# Patient Record
Sex: Male | Born: 1950 | Race: White | Hispanic: No | Marital: Married
Health system: Southern US, Community
[De-identification: ages and names within clinical notes are randomized; demographics above are authoritative.]

## PROBLEM LIST (undated history)

## (undated) DIAGNOSIS — I10 Essential (primary) hypertension: Secondary | ICD-10-CM

## (undated) DIAGNOSIS — E559 Vitamin D deficiency, unspecified: Secondary | ICD-10-CM

## (undated) DIAGNOSIS — E785 Hyperlipidemia, unspecified: Secondary | ICD-10-CM

## (undated) DIAGNOSIS — E079 Disorder of thyroid, unspecified: Secondary | ICD-10-CM

## (undated) DIAGNOSIS — C801 Malignant (primary) neoplasm, unspecified: Secondary | ICD-10-CM

## (undated) DIAGNOSIS — E039 Hypothyroidism, unspecified: Secondary | ICD-10-CM

## (undated) DIAGNOSIS — I70201 Unspecified atherosclerosis of native arteries of extremities, right leg: Secondary | ICD-10-CM

## (undated) DIAGNOSIS — K219 Gastro-esophageal reflux disease without esophagitis: Secondary | ICD-10-CM

## (undated) DIAGNOSIS — E782 Mixed hyperlipidemia: Secondary | ICD-10-CM

## (undated) HISTORY — DX: Essential (primary) hypertension: I10

## (undated) HISTORY — DX: Disorder of thyroid, unspecified: E07.9

## (undated) HISTORY — DX: Hyperlipidemia, unspecified: E78.5

## (undated) HISTORY — PX: APPENDECTOMY: SHX54

---

## 2004-10-23 ENCOUNTER — Ambulatory Visit: Payer: Self-pay | Admitting: Internal Medicine

## 2008-02-15 ENCOUNTER — Ambulatory Visit: Payer: Self-pay | Admitting: Internal Medicine

## 2016-11-12 DIAGNOSIS — Z79899 Other long term (current) drug therapy: Secondary | ICD-10-CM | POA: Diagnosis not present

## 2016-11-12 DIAGNOSIS — E119 Type 2 diabetes mellitus without complications: Secondary | ICD-10-CM | POA: Diagnosis not present

## 2016-11-12 DIAGNOSIS — E782 Mixed hyperlipidemia: Secondary | ICD-10-CM | POA: Diagnosis not present

## 2016-11-12 DIAGNOSIS — Z0000 Encounter for general adult medical examination without abnormal findings: Secondary | ICD-10-CM | POA: Diagnosis not present

## 2016-11-12 DIAGNOSIS — E079 Disorder of thyroid, unspecified: Secondary | ICD-10-CM | POA: Diagnosis not present

## 2016-11-26 DIAGNOSIS — Z79899 Other long term (current) drug therapy: Secondary | ICD-10-CM | POA: Diagnosis not present

## 2016-11-26 DIAGNOSIS — E782 Mixed hyperlipidemia: Secondary | ICD-10-CM | POA: Diagnosis not present

## 2016-11-26 DIAGNOSIS — E119 Type 2 diabetes mellitus without complications: Secondary | ICD-10-CM | POA: Diagnosis not present

## 2016-11-26 DIAGNOSIS — E079 Disorder of thyroid, unspecified: Secondary | ICD-10-CM | POA: Diagnosis not present

## 2017-04-27 DIAGNOSIS — M19011 Primary osteoarthritis, right shoulder: Secondary | ICD-10-CM | POA: Diagnosis not present

## 2017-04-27 DIAGNOSIS — M79601 Pain in right arm: Secondary | ICD-10-CM | POA: Diagnosis not present

## 2017-05-19 DIAGNOSIS — Z0000 Encounter for general adult medical examination without abnormal findings: Secondary | ICD-10-CM | POA: Diagnosis not present

## 2017-05-19 DIAGNOSIS — Z79899 Other long term (current) drug therapy: Secondary | ICD-10-CM | POA: Diagnosis not present

## 2017-05-19 DIAGNOSIS — E119 Type 2 diabetes mellitus without complications: Secondary | ICD-10-CM | POA: Diagnosis not present

## 2017-05-19 DIAGNOSIS — E079 Disorder of thyroid, unspecified: Secondary | ICD-10-CM | POA: Diagnosis not present

## 2017-05-19 DIAGNOSIS — Z1211 Encounter for screening for malignant neoplasm of colon: Secondary | ICD-10-CM | POA: Diagnosis not present

## 2017-05-19 DIAGNOSIS — Z125 Encounter for screening for malignant neoplasm of prostate: Secondary | ICD-10-CM | POA: Diagnosis not present

## 2017-05-19 DIAGNOSIS — E782 Mixed hyperlipidemia: Secondary | ICD-10-CM | POA: Diagnosis not present

## 2017-05-20 DIAGNOSIS — M7581 Other shoulder lesions, right shoulder: Secondary | ICD-10-CM | POA: Diagnosis not present

## 2017-05-30 DIAGNOSIS — Z1211 Encounter for screening for malignant neoplasm of colon: Secondary | ICD-10-CM | POA: Diagnosis not present

## 2017-12-16 DIAGNOSIS — E782 Mixed hyperlipidemia: Secondary | ICD-10-CM | POA: Diagnosis not present

## 2017-12-16 DIAGNOSIS — Z79899 Other long term (current) drug therapy: Secondary | ICD-10-CM | POA: Diagnosis not present

## 2017-12-16 DIAGNOSIS — Z0000 Encounter for general adult medical examination without abnormal findings: Secondary | ICD-10-CM | POA: Diagnosis not present

## 2017-12-16 DIAGNOSIS — E079 Disorder of thyroid, unspecified: Secondary | ICD-10-CM | POA: Diagnosis not present

## 2017-12-16 DIAGNOSIS — E119 Type 2 diabetes mellitus without complications: Secondary | ICD-10-CM | POA: Diagnosis not present

## 2018-03-10 DIAGNOSIS — E782 Mixed hyperlipidemia: Secondary | ICD-10-CM | POA: Diagnosis not present

## 2018-03-10 DIAGNOSIS — Z125 Encounter for screening for malignant neoplasm of prostate: Secondary | ICD-10-CM | POA: Diagnosis not present

## 2018-03-10 DIAGNOSIS — Z79899 Other long term (current) drug therapy: Secondary | ICD-10-CM | POA: Diagnosis not present

## 2018-03-10 DIAGNOSIS — E119 Type 2 diabetes mellitus without complications: Secondary | ICD-10-CM | POA: Diagnosis not present

## 2018-03-10 DIAGNOSIS — E079 Disorder of thyroid, unspecified: Secondary | ICD-10-CM | POA: Diagnosis not present

## 2018-06-16 DIAGNOSIS — E782 Mixed hyperlipidemia: Secondary | ICD-10-CM | POA: Diagnosis not present

## 2018-06-16 DIAGNOSIS — E079 Disorder of thyroid, unspecified: Secondary | ICD-10-CM | POA: Diagnosis not present

## 2018-06-16 DIAGNOSIS — E119 Type 2 diabetes mellitus without complications: Secondary | ICD-10-CM | POA: Diagnosis not present

## 2018-06-16 DIAGNOSIS — Z125 Encounter for screening for malignant neoplasm of prostate: Secondary | ICD-10-CM | POA: Diagnosis not present

## 2018-06-16 DIAGNOSIS — Z79899 Other long term (current) drug therapy: Secondary | ICD-10-CM | POA: Diagnosis not present

## 2018-06-23 DIAGNOSIS — I10 Essential (primary) hypertension: Secondary | ICD-10-CM | POA: Diagnosis not present

## 2018-06-23 DIAGNOSIS — E119 Type 2 diabetes mellitus without complications: Secondary | ICD-10-CM | POA: Diagnosis not present

## 2018-06-23 DIAGNOSIS — E782 Mixed hyperlipidemia: Secondary | ICD-10-CM | POA: Diagnosis not present

## 2018-06-23 DIAGNOSIS — Z0000 Encounter for general adult medical examination without abnormal findings: Secondary | ICD-10-CM | POA: Diagnosis not present

## 2018-06-23 DIAGNOSIS — E079 Disorder of thyroid, unspecified: Secondary | ICD-10-CM | POA: Diagnosis not present

## 2018-06-23 DIAGNOSIS — Z1211 Encounter for screening for malignant neoplasm of colon: Secondary | ICD-10-CM | POA: Diagnosis not present

## 2018-06-28 DIAGNOSIS — Z1211 Encounter for screening for malignant neoplasm of colon: Secondary | ICD-10-CM | POA: Diagnosis not present

## 2018-09-22 DIAGNOSIS — Z79899 Other long term (current) drug therapy: Secondary | ICD-10-CM | POA: Diagnosis not present

## 2018-09-22 DIAGNOSIS — I10 Essential (primary) hypertension: Secondary | ICD-10-CM | POA: Diagnosis not present

## 2018-09-22 DIAGNOSIS — E079 Disorder of thyroid, unspecified: Secondary | ICD-10-CM | POA: Diagnosis not present

## 2018-09-22 DIAGNOSIS — E119 Type 2 diabetes mellitus without complications: Secondary | ICD-10-CM | POA: Diagnosis not present

## 2018-09-22 DIAGNOSIS — E782 Mixed hyperlipidemia: Secondary | ICD-10-CM | POA: Diagnosis not present

## 2018-12-22 DIAGNOSIS — Z79899 Other long term (current) drug therapy: Secondary | ICD-10-CM | POA: Diagnosis not present

## 2018-12-22 DIAGNOSIS — E079 Disorder of thyroid, unspecified: Secondary | ICD-10-CM | POA: Diagnosis not present

## 2018-12-22 DIAGNOSIS — E119 Type 2 diabetes mellitus without complications: Secondary | ICD-10-CM | POA: Diagnosis not present

## 2018-12-22 DIAGNOSIS — E782 Mixed hyperlipidemia: Secondary | ICD-10-CM | POA: Diagnosis not present

## 2018-12-29 DIAGNOSIS — I10 Essential (primary) hypertension: Secondary | ICD-10-CM | POA: Diagnosis not present

## 2018-12-29 DIAGNOSIS — Z79899 Other long term (current) drug therapy: Secondary | ICD-10-CM | POA: Diagnosis not present

## 2018-12-29 DIAGNOSIS — E079 Disorder of thyroid, unspecified: Secondary | ICD-10-CM | POA: Diagnosis not present

## 2018-12-29 DIAGNOSIS — E119 Type 2 diabetes mellitus without complications: Secondary | ICD-10-CM | POA: Diagnosis not present

## 2018-12-29 DIAGNOSIS — E782 Mixed hyperlipidemia: Secondary | ICD-10-CM | POA: Diagnosis not present

## 2018-12-29 DIAGNOSIS — Z125 Encounter for screening for malignant neoplasm of prostate: Secondary | ICD-10-CM | POA: Diagnosis not present

## 2019-06-29 DIAGNOSIS — I10 Essential (primary) hypertension: Secondary | ICD-10-CM | POA: Diagnosis not present

## 2019-06-29 DIAGNOSIS — E079 Disorder of thyroid, unspecified: Secondary | ICD-10-CM | POA: Diagnosis not present

## 2019-06-29 DIAGNOSIS — Z125 Encounter for screening for malignant neoplasm of prostate: Secondary | ICD-10-CM | POA: Diagnosis not present

## 2019-06-29 DIAGNOSIS — Z79899 Other long term (current) drug therapy: Secondary | ICD-10-CM | POA: Diagnosis not present

## 2019-06-29 DIAGNOSIS — E782 Mixed hyperlipidemia: Secondary | ICD-10-CM | POA: Diagnosis not present

## 2019-06-29 DIAGNOSIS — E119 Type 2 diabetes mellitus without complications: Secondary | ICD-10-CM | POA: Diagnosis not present

## 2019-07-06 DIAGNOSIS — E782 Mixed hyperlipidemia: Secondary | ICD-10-CM | POA: Diagnosis not present

## 2019-07-06 DIAGNOSIS — E079 Disorder of thyroid, unspecified: Secondary | ICD-10-CM | POA: Diagnosis not present

## 2019-07-06 DIAGNOSIS — Z0000 Encounter for general adult medical examination without abnormal findings: Secondary | ICD-10-CM | POA: Diagnosis not present

## 2019-07-06 DIAGNOSIS — I10 Essential (primary) hypertension: Secondary | ICD-10-CM | POA: Diagnosis not present

## 2019-07-06 DIAGNOSIS — Z79899 Other long term (current) drug therapy: Secondary | ICD-10-CM | POA: Diagnosis not present

## 2019-07-06 DIAGNOSIS — Z1211 Encounter for screening for malignant neoplasm of colon: Secondary | ICD-10-CM | POA: Diagnosis not present

## 2019-07-06 DIAGNOSIS — E119 Type 2 diabetes mellitus without complications: Secondary | ICD-10-CM | POA: Diagnosis not present

## 2019-07-12 DIAGNOSIS — Z1211 Encounter for screening for malignant neoplasm of colon: Secondary | ICD-10-CM | POA: Diagnosis not present

## 2019-07-28 ENCOUNTER — Ambulatory Visit: Payer: PPO | Attending: Internal Medicine

## 2019-07-28 DIAGNOSIS — Z23 Encounter for immunization: Secondary | ICD-10-CM

## 2019-07-28 NOTE — Progress Notes (Signed)
   Covid-19 Vaccination Clinic  Name:  Tony Solomon    MRN: [id number] DOB: [birth year]  07/28/2019  [name] was observed post Covid-19 immunization for 15 minutes without incidence. He was provided with Vaccine Information Sheet and instruction to access the V-Safe system.   [name] was instructed to call 911 with any severe reactions post vaccine: Marland Kitchen Difficulty breathing  . Swelling of your face and throat  . A fast heartbeat  . A bad rash all over your body  . Dizziness and weakness    Immunizations Administered    Name Date Dose VIS Date Route   Moderna COVID-19 Vaccine 07/28/2019 12:18 PM 0.5 mL 05/01/2019 Intramuscular   Manufacturer: Moderna   Lot: [id number]   [place]:[id number]

## 2019-08-25 ENCOUNTER — Ambulatory Visit: Payer: PPO | Attending: Internal Medicine

## 2019-08-25 DIAGNOSIS — Z23 Encounter for immunization: Secondary | ICD-10-CM

## 2019-08-25 NOTE — Progress Notes (Signed)
   Covid-19 Vaccination Clinic  Name:  Tony Solomon    MRN: [id number] DOB: 07-26-1950  08/25/2019  [name] was observed post Covid-19 immunization for 15 minutes without incident. He was provided with Vaccine Information Sheet and instruction to access the V-Safe system.   [name] was instructed to call 911 with any severe reactions post vaccine: Marland Kitchen Difficulty breathing  . Swelling of face and throat  . A fast heartbeat  . A bad rash all over body  . Dizziness and weakness   Immunizations Administered    Name Date Dose VIS Date Route   Moderna COVID-19 Vaccine 08/25/2019  9:48 AM 0.5 mL 05/01/2019 Intramuscular   Manufacturer: Moderna   Lot: [id number]   [place]:[id number]

## 2019-12-28 DIAGNOSIS — E119 Type 2 diabetes mellitus without complications: Secondary | ICD-10-CM | POA: Diagnosis not present

## 2019-12-28 DIAGNOSIS — E079 Disorder of thyroid, unspecified: Secondary | ICD-10-CM | POA: Diagnosis not present

## 2019-12-28 DIAGNOSIS — Z79899 Other long term (current) drug therapy: Secondary | ICD-10-CM | POA: Diagnosis not present

## 2019-12-28 DIAGNOSIS — E782 Mixed hyperlipidemia: Secondary | ICD-10-CM | POA: Diagnosis not present

## 2019-12-28 DIAGNOSIS — I10 Essential (primary) hypertension: Secondary | ICD-10-CM | POA: Diagnosis not present

## 2020-01-04 DIAGNOSIS — E079 Disorder of thyroid, unspecified: Secondary | ICD-10-CM | POA: Diagnosis not present

## 2020-01-04 DIAGNOSIS — Z125 Encounter for screening for malignant neoplasm of prostate: Secondary | ICD-10-CM | POA: Diagnosis not present

## 2020-01-04 DIAGNOSIS — Z79899 Other long term (current) drug therapy: Secondary | ICD-10-CM | POA: Diagnosis not present

## 2020-01-04 DIAGNOSIS — E782 Mixed hyperlipidemia: Secondary | ICD-10-CM | POA: Diagnosis not present

## 2020-01-04 DIAGNOSIS — E119 Type 2 diabetes mellitus without complications: Secondary | ICD-10-CM | POA: Diagnosis not present

## 2020-01-04 DIAGNOSIS — I10 Essential (primary) hypertension: Secondary | ICD-10-CM | POA: Diagnosis not present

## 2020-03-12 DIAGNOSIS — E119 Type 2 diabetes mellitus without complications: Secondary | ICD-10-CM | POA: Diagnosis not present

## 2020-07-04 DIAGNOSIS — I10 Essential (primary) hypertension: Secondary | ICD-10-CM | POA: Diagnosis not present

## 2020-07-04 DIAGNOSIS — E079 Disorder of thyroid, unspecified: Secondary | ICD-10-CM | POA: Diagnosis not present

## 2020-07-04 DIAGNOSIS — E119 Type 2 diabetes mellitus without complications: Secondary | ICD-10-CM | POA: Diagnosis not present

## 2020-07-04 DIAGNOSIS — Z125 Encounter for screening for malignant neoplasm of prostate: Secondary | ICD-10-CM | POA: Diagnosis not present

## 2020-07-04 DIAGNOSIS — Z79899 Other long term (current) drug therapy: Secondary | ICD-10-CM | POA: Diagnosis not present

## 2020-07-04 DIAGNOSIS — E782 Mixed hyperlipidemia: Secondary | ICD-10-CM | POA: Diagnosis not present

## 2020-07-11 DIAGNOSIS — E7800 Pure hypercholesterolemia, unspecified: Secondary | ICD-10-CM | POA: Diagnosis not present

## 2020-07-11 DIAGNOSIS — I10 Essential (primary) hypertension: Secondary | ICD-10-CM | POA: Diagnosis not present

## 2020-07-11 DIAGNOSIS — R011 Cardiac murmur, unspecified: Secondary | ICD-10-CM | POA: Diagnosis not present

## 2020-07-11 DIAGNOSIS — Z79899 Other long term (current) drug therapy: Secondary | ICD-10-CM | POA: Diagnosis not present

## 2020-07-11 DIAGNOSIS — Z1211 Encounter for screening for malignant neoplasm of colon: Secondary | ICD-10-CM | POA: Diagnosis not present

## 2020-07-11 DIAGNOSIS — E119 Type 2 diabetes mellitus without complications: Secondary | ICD-10-CM | POA: Diagnosis not present

## 2020-07-11 DIAGNOSIS — E079 Disorder of thyroid, unspecified: Secondary | ICD-10-CM | POA: Diagnosis not present

## 2020-07-11 DIAGNOSIS — Z0000 Encounter for general adult medical examination without abnormal findings: Secondary | ICD-10-CM | POA: Diagnosis not present

## 2020-07-15 DIAGNOSIS — Z0000 Encounter for general adult medical examination without abnormal findings: Secondary | ICD-10-CM | POA: Diagnosis not present

## 2020-07-15 DIAGNOSIS — E079 Disorder of thyroid, unspecified: Secondary | ICD-10-CM | POA: Diagnosis not present

## 2020-07-15 DIAGNOSIS — Z1211 Encounter for screening for malignant neoplasm of colon: Secondary | ICD-10-CM | POA: Diagnosis not present

## 2020-07-15 DIAGNOSIS — E119 Type 2 diabetes mellitus without complications: Secondary | ICD-10-CM | POA: Diagnosis not present

## 2020-07-15 DIAGNOSIS — Z79899 Other long term (current) drug therapy: Secondary | ICD-10-CM | POA: Diagnosis not present

## 2020-07-15 DIAGNOSIS — E7800 Pure hypercholesterolemia, unspecified: Secondary | ICD-10-CM | POA: Diagnosis not present

## 2020-07-15 DIAGNOSIS — I10 Essential (primary) hypertension: Secondary | ICD-10-CM | POA: Diagnosis not present

## 2020-07-15 DIAGNOSIS — R011 Cardiac murmur, unspecified: Secondary | ICD-10-CM | POA: Diagnosis not present

## 2020-08-20 DIAGNOSIS — R011 Cardiac murmur, unspecified: Secondary | ICD-10-CM | POA: Diagnosis not present

## 2020-10-16 DIAGNOSIS — E079 Disorder of thyroid, unspecified: Secondary | ICD-10-CM | POA: Diagnosis not present

## 2020-10-16 DIAGNOSIS — Z79899 Other long term (current) drug therapy: Secondary | ICD-10-CM | POA: Diagnosis not present

## 2020-10-16 DIAGNOSIS — E119 Type 2 diabetes mellitus without complications: Secondary | ICD-10-CM | POA: Diagnosis not present

## 2020-10-16 DIAGNOSIS — E7800 Pure hypercholesterolemia, unspecified: Secondary | ICD-10-CM | POA: Diagnosis not present

## 2020-10-23 DIAGNOSIS — Z79899 Other long term (current) drug therapy: Secondary | ICD-10-CM | POA: Diagnosis not present

## 2020-10-23 DIAGNOSIS — E079 Disorder of thyroid, unspecified: Secondary | ICD-10-CM | POA: Diagnosis not present

## 2020-10-23 DIAGNOSIS — E119 Type 2 diabetes mellitus without complications: Secondary | ICD-10-CM | POA: Diagnosis not present

## 2020-10-23 DIAGNOSIS — E782 Mixed hyperlipidemia: Secondary | ICD-10-CM | POA: Diagnosis not present

## 2020-10-23 DIAGNOSIS — I10 Essential (primary) hypertension: Secondary | ICD-10-CM | POA: Diagnosis not present

## 2020-12-25 DIAGNOSIS — M545 Low back pain, unspecified: Secondary | ICD-10-CM | POA: Diagnosis not present

## 2020-12-25 DIAGNOSIS — M5489 Other dorsalgia: Secondary | ICD-10-CM | POA: Diagnosis not present

## 2021-04-17 DIAGNOSIS — M5442 Lumbago with sciatica, left side: Secondary | ICD-10-CM | POA: Diagnosis not present

## 2021-04-17 DIAGNOSIS — G5622 Lesion of ulnar nerve, left upper limb: Secondary | ICD-10-CM | POA: Diagnosis not present

## 2021-04-20 DIAGNOSIS — E782 Mixed hyperlipidemia: Secondary | ICD-10-CM | POA: Diagnosis not present

## 2021-04-20 DIAGNOSIS — E119 Type 2 diabetes mellitus without complications: Secondary | ICD-10-CM | POA: Diagnosis not present

## 2021-04-20 DIAGNOSIS — Z79899 Other long term (current) drug therapy: Secondary | ICD-10-CM | POA: Diagnosis not present

## 2021-04-20 DIAGNOSIS — E079 Disorder of thyroid, unspecified: Secondary | ICD-10-CM | POA: Diagnosis not present

## 2021-04-27 DIAGNOSIS — Z125 Encounter for screening for malignant neoplasm of prostate: Secondary | ICD-10-CM | POA: Diagnosis not present

## 2021-04-27 DIAGNOSIS — E118 Type 2 diabetes mellitus with unspecified complications: Secondary | ICD-10-CM | POA: Diagnosis not present

## 2021-04-27 DIAGNOSIS — E079 Disorder of thyroid, unspecified: Secondary | ICD-10-CM | POA: Diagnosis not present

## 2021-04-27 DIAGNOSIS — E7800 Pure hypercholesterolemia, unspecified: Secondary | ICD-10-CM | POA: Diagnosis not present

## 2021-04-27 DIAGNOSIS — I10 Essential (primary) hypertension: Secondary | ICD-10-CM | POA: Diagnosis not present

## 2021-04-27 DIAGNOSIS — Z79899 Other long term (current) drug therapy: Secondary | ICD-10-CM | POA: Diagnosis not present

## 2021-06-25 ENCOUNTER — Other Ambulatory Visit: Payer: Self-pay | Admitting: Physical Medicine & Rehabilitation

## 2021-06-25 DIAGNOSIS — M5442 Lumbago with sciatica, left side: Secondary | ICD-10-CM

## 2021-06-25 DIAGNOSIS — G5622 Lesion of ulnar nerve, left upper limb: Secondary | ICD-10-CM | POA: Diagnosis not present

## 2021-06-28 ENCOUNTER — Other Ambulatory Visit: Payer: Self-pay

## 2021-06-28 ENCOUNTER — Ambulatory Visit
Admission: RE | Admit: 2021-06-28 | Discharge: 2021-06-28 | Disposition: A | Discharge status: Home / Self Care | Payer: PPO | Source: Ambulatory Visit | Attending: Physical Medicine & Rehabilitation | Admitting: Physical Medicine & Rehabilitation

## 2021-06-28 DIAGNOSIS — M5442 Lumbago with sciatica, left side: Secondary | ICD-10-CM | POA: Diagnosis not present

## 2021-06-28 DIAGNOSIS — M5127 Other intervertebral disc displacement, lumbosacral region: Secondary | ICD-10-CM | POA: Diagnosis not present

## 2021-06-28 DIAGNOSIS — M47816 Spondylosis without myelopathy or radiculopathy, lumbar region: Secondary | ICD-10-CM | POA: Diagnosis not present

## 2021-07-02 DIAGNOSIS — M5442 Lumbago with sciatica, left side: Secondary | ICD-10-CM | POA: Diagnosis not present

## 2021-07-02 DIAGNOSIS — G5622 Lesion of ulnar nerve, left upper limb: Secondary | ICD-10-CM | POA: Diagnosis not present

## 2021-07-02 DIAGNOSIS — M48062 Spinal stenosis, lumbar region with neurogenic claudication: Secondary | ICD-10-CM | POA: Diagnosis not present

## 2021-07-09 ENCOUNTER — Ambulatory Visit: Payer: PPO

## 2021-07-10 DIAGNOSIS — E119 Type 2 diabetes mellitus without complications: Secondary | ICD-10-CM | POA: Diagnosis not present

## 2021-07-10 DIAGNOSIS — M48062 Spinal stenosis, lumbar region with neurogenic claudication: Secondary | ICD-10-CM | POA: Diagnosis not present

## 2021-07-24 DIAGNOSIS — M48062 Spinal stenosis, lumbar region with neurogenic claudication: Secondary | ICD-10-CM | POA: Diagnosis not present

## 2021-07-24 DIAGNOSIS — M5442 Lumbago with sciatica, left side: Secondary | ICD-10-CM | POA: Diagnosis not present

## 2021-10-09 DIAGNOSIS — H66003 Acute suppurative otitis media without spontaneous rupture of ear drum, bilateral: Secondary | ICD-10-CM | POA: Diagnosis not present

## 2021-10-09 DIAGNOSIS — R112 Nausea with vomiting, unspecified: Secondary | ICD-10-CM | POA: Diagnosis not present

## 2021-10-09 DIAGNOSIS — J019 Acute sinusitis, unspecified: Secondary | ICD-10-CM | POA: Diagnosis not present

## 2021-10-09 DIAGNOSIS — B9689 Other specified bacterial agents as the cause of diseases classified elsewhere: Secondary | ICD-10-CM | POA: Diagnosis not present

## 2021-10-09 DIAGNOSIS — H8309 Labyrinthitis, unspecified ear: Secondary | ICD-10-CM | POA: Diagnosis not present

## 2021-10-09 DIAGNOSIS — Z03818 Encounter for observation for suspected exposure to other biological agents ruled out: Secondary | ICD-10-CM | POA: Diagnosis not present

## 2021-10-09 DIAGNOSIS — J302 Other seasonal allergic rhinitis: Secondary | ICD-10-CM | POA: Diagnosis not present

## 2021-10-23 DIAGNOSIS — E079 Disorder of thyroid, unspecified: Secondary | ICD-10-CM | POA: Diagnosis not present

## 2021-10-23 DIAGNOSIS — Z125 Encounter for screening for malignant neoplasm of prostate: Secondary | ICD-10-CM | POA: Diagnosis not present

## 2021-10-23 DIAGNOSIS — I10 Essential (primary) hypertension: Secondary | ICD-10-CM | POA: Diagnosis not present

## 2021-10-23 DIAGNOSIS — Z79899 Other long term (current) drug therapy: Secondary | ICD-10-CM | POA: Diagnosis not present

## 2021-10-23 DIAGNOSIS — E118 Type 2 diabetes mellitus with unspecified complications: Secondary | ICD-10-CM | POA: Diagnosis not present

## 2021-10-23 DIAGNOSIS — E7800 Pure hypercholesterolemia, unspecified: Secondary | ICD-10-CM | POA: Diagnosis not present

## 2021-10-30 DIAGNOSIS — Z79899 Other long term (current) drug therapy: Secondary | ICD-10-CM | POA: Diagnosis not present

## 2021-10-30 DIAGNOSIS — E079 Disorder of thyroid, unspecified: Secondary | ICD-10-CM | POA: Diagnosis not present

## 2021-10-30 DIAGNOSIS — Z0000 Encounter for general adult medical examination without abnormal findings: Secondary | ICD-10-CM | POA: Diagnosis not present

## 2021-10-30 DIAGNOSIS — Z1211 Encounter for screening for malignant neoplasm of colon: Secondary | ICD-10-CM | POA: Diagnosis not present

## 2021-10-30 DIAGNOSIS — E782 Mixed hyperlipidemia: Secondary | ICD-10-CM | POA: Diagnosis not present

## 2021-10-30 DIAGNOSIS — E118 Type 2 diabetes mellitus with unspecified complications: Secondary | ICD-10-CM | POA: Diagnosis not present

## 2021-10-30 DIAGNOSIS — I10 Essential (primary) hypertension: Secondary | ICD-10-CM | POA: Diagnosis not present

## 2021-11-03 DIAGNOSIS — Z1211 Encounter for screening for malignant neoplasm of colon: Secondary | ICD-10-CM | POA: Diagnosis not present

## 2021-11-26 DIAGNOSIS — Z85828 Personal history of other malignant neoplasm of skin: Secondary | ICD-10-CM | POA: Diagnosis not present

## 2021-11-26 DIAGNOSIS — L814 Other melanin hyperpigmentation: Secondary | ICD-10-CM | POA: Diagnosis not present

## 2021-11-26 DIAGNOSIS — C44319 Basal cell carcinoma of skin of other parts of face: Secondary | ICD-10-CM | POA: Diagnosis not present

## 2021-11-26 DIAGNOSIS — L578 Other skin changes due to chronic exposure to nonionizing radiation: Secondary | ICD-10-CM | POA: Diagnosis not present

## 2021-11-26 DIAGNOSIS — L821 Other seborrheic keratosis: Secondary | ICD-10-CM | POA: Diagnosis not present

## 2021-11-26 DIAGNOSIS — L570 Actinic keratosis: Secondary | ICD-10-CM | POA: Diagnosis not present

## 2021-11-26 DIAGNOSIS — C44329 Squamous cell carcinoma of skin of other parts of face: Secondary | ICD-10-CM | POA: Diagnosis not present

## 2021-11-26 DIAGNOSIS — D492 Neoplasm of unspecified behavior of bone, soft tissue, and skin: Secondary | ICD-10-CM | POA: Diagnosis not present

## 2021-12-04 DIAGNOSIS — Z79899 Other long term (current) drug therapy: Secondary | ICD-10-CM | POA: Diagnosis not present

## 2021-12-25 DIAGNOSIS — L814 Other melanin hyperpigmentation: Secondary | ICD-10-CM | POA: Diagnosis not present

## 2021-12-25 DIAGNOSIS — L578 Other skin changes due to chronic exposure to nonionizing radiation: Secondary | ICD-10-CM | POA: Diagnosis not present

## 2021-12-25 DIAGNOSIS — C44329 Squamous cell carcinoma of skin of other parts of face: Secondary | ICD-10-CM | POA: Diagnosis not present

## 2021-12-25 DIAGNOSIS — C44319 Basal cell carcinoma of skin of other parts of face: Secondary | ICD-10-CM | POA: Diagnosis not present

## 2021-12-25 DIAGNOSIS — L988 Other specified disorders of the skin and subcutaneous tissue: Secondary | ICD-10-CM | POA: Diagnosis not present

## 2022-02-22 DIAGNOSIS — M5416 Radiculopathy, lumbar region: Secondary | ICD-10-CM | POA: Diagnosis not present

## 2022-02-22 DIAGNOSIS — M48062 Spinal stenosis, lumbar region with neurogenic claudication: Secondary | ICD-10-CM | POA: Diagnosis not present

## 2022-03-26 DIAGNOSIS — M5416 Radiculopathy, lumbar region: Secondary | ICD-10-CM | POA: Diagnosis not present

## 2022-03-26 DIAGNOSIS — M5136 Other intervertebral disc degeneration, lumbar region: Secondary | ICD-10-CM | POA: Diagnosis not present

## 2022-05-07 DIAGNOSIS — I10 Essential (primary) hypertension: Secondary | ICD-10-CM | POA: Diagnosis not present

## 2022-05-07 DIAGNOSIS — E079 Disorder of thyroid, unspecified: Secondary | ICD-10-CM | POA: Diagnosis not present

## 2022-05-07 DIAGNOSIS — E782 Mixed hyperlipidemia: Secondary | ICD-10-CM | POA: Diagnosis not present

## 2022-05-07 DIAGNOSIS — Z79899 Other long term (current) drug therapy: Secondary | ICD-10-CM | POA: Diagnosis not present

## 2022-05-07 DIAGNOSIS — E118 Type 2 diabetes mellitus with unspecified complications: Secondary | ICD-10-CM | POA: Diagnosis not present

## 2022-05-28 DIAGNOSIS — M48062 Spinal stenosis, lumbar region with neurogenic claudication: Secondary | ICD-10-CM | POA: Diagnosis not present

## 2022-05-28 DIAGNOSIS — M5416 Radiculopathy, lumbar region: Secondary | ICD-10-CM | POA: Diagnosis not present

## 2022-06-01 DIAGNOSIS — L570 Actinic keratosis: Secondary | ICD-10-CM | POA: Diagnosis not present

## 2022-06-01 DIAGNOSIS — Z85828 Personal history of other malignant neoplasm of skin: Secondary | ICD-10-CM | POA: Diagnosis not present

## 2022-06-01 DIAGNOSIS — L578 Other skin changes due to chronic exposure to nonionizing radiation: Secondary | ICD-10-CM | POA: Diagnosis not present

## 2022-06-18 DIAGNOSIS — M5416 Radiculopathy, lumbar region: Secondary | ICD-10-CM | POA: Diagnosis not present

## 2022-06-18 DIAGNOSIS — M5136 Other intervertebral disc degeneration, lumbar region: Secondary | ICD-10-CM | POA: Diagnosis not present

## 2022-06-18 DIAGNOSIS — M48062 Spinal stenosis, lumbar region with neurogenic claudication: Secondary | ICD-10-CM | POA: Diagnosis not present

## 2022-09-06 DIAGNOSIS — M79605 Pain in left leg: Secondary | ICD-10-CM | POA: Diagnosis not present

## 2022-09-06 DIAGNOSIS — M79604 Pain in right leg: Secondary | ICD-10-CM | POA: Diagnosis not present

## 2022-09-14 DIAGNOSIS — L578 Other skin changes due to chronic exposure to nonionizing radiation: Secondary | ICD-10-CM | POA: Diagnosis not present

## 2022-09-14 DIAGNOSIS — L814 Other melanin hyperpigmentation: Secondary | ICD-10-CM | POA: Diagnosis not present

## 2022-09-14 DIAGNOSIS — D492 Neoplasm of unspecified behavior of bone, soft tissue, and skin: Secondary | ICD-10-CM | POA: Diagnosis not present

## 2022-09-14 DIAGNOSIS — Z86007 Personal history of in-situ neoplasm of skin: Secondary | ICD-10-CM | POA: Diagnosis not present

## 2022-09-14 DIAGNOSIS — L821 Other seborrheic keratosis: Secondary | ICD-10-CM | POA: Diagnosis not present

## 2022-09-14 DIAGNOSIS — L570 Actinic keratosis: Secondary | ICD-10-CM | POA: Diagnosis not present

## 2022-11-15 DIAGNOSIS — Z0000 Encounter for general adult medical examination without abnormal findings: Secondary | ICD-10-CM | POA: Diagnosis not present

## 2022-11-15 DIAGNOSIS — E079 Disorder of thyroid, unspecified: Secondary | ICD-10-CM | POA: Diagnosis not present

## 2022-11-15 DIAGNOSIS — I10 Essential (primary) hypertension: Secondary | ICD-10-CM | POA: Diagnosis not present

## 2022-11-15 DIAGNOSIS — E118 Type 2 diabetes mellitus with unspecified complications: Secondary | ICD-10-CM | POA: Diagnosis not present

## 2022-11-15 DIAGNOSIS — Z1211 Encounter for screening for malignant neoplasm of colon: Secondary | ICD-10-CM | POA: Diagnosis not present

## 2022-11-15 DIAGNOSIS — Z79899 Other long term (current) drug therapy: Secondary | ICD-10-CM | POA: Diagnosis not present

## 2022-11-15 DIAGNOSIS — E559 Vitamin D deficiency, unspecified: Secondary | ICD-10-CM | POA: Diagnosis not present

## 2022-11-15 DIAGNOSIS — Z125 Encounter for screening for malignant neoplasm of prostate: Secondary | ICD-10-CM | POA: Diagnosis not present

## 2022-11-15 DIAGNOSIS — E782 Mixed hyperlipidemia: Secondary | ICD-10-CM | POA: Diagnosis not present

## 2022-11-19 DIAGNOSIS — Z1211 Encounter for screening for malignant neoplasm of colon: Secondary | ICD-10-CM | POA: Diagnosis not present

## 2023-02-23 DIAGNOSIS — E871 Hypo-osmolality and hyponatremia: Secondary | ICD-10-CM | POA: Diagnosis not present

## 2023-02-23 DIAGNOSIS — Z79899 Other long term (current) drug therapy: Secondary | ICD-10-CM | POA: Diagnosis not present

## 2023-02-23 DIAGNOSIS — D649 Anemia, unspecified: Secondary | ICD-10-CM | POA: Diagnosis not present

## 2023-03-31 DIAGNOSIS — D649 Anemia, unspecified: Secondary | ICD-10-CM | POA: Diagnosis not present

## 2023-05-09 DIAGNOSIS — E782 Mixed hyperlipidemia: Secondary | ICD-10-CM | POA: Diagnosis not present

## 2023-05-09 DIAGNOSIS — E118 Type 2 diabetes mellitus with unspecified complications: Secondary | ICD-10-CM | POA: Diagnosis not present

## 2023-05-09 DIAGNOSIS — E079 Disorder of thyroid, unspecified: Secondary | ICD-10-CM | POA: Diagnosis not present

## 2023-05-09 DIAGNOSIS — Z79899 Other long term (current) drug therapy: Secondary | ICD-10-CM | POA: Diagnosis not present

## 2023-05-16 DIAGNOSIS — E079 Disorder of thyroid, unspecified: Secondary | ICD-10-CM | POA: Diagnosis not present

## 2023-05-16 DIAGNOSIS — E118 Type 2 diabetes mellitus with unspecified complications: Secondary | ICD-10-CM | POA: Diagnosis not present

## 2023-05-16 DIAGNOSIS — I10 Essential (primary) hypertension: Secondary | ICD-10-CM | POA: Diagnosis not present

## 2023-05-16 DIAGNOSIS — Z0000 Encounter for general adult medical examination without abnormal findings: Secondary | ICD-10-CM | POA: Diagnosis not present

## 2023-05-16 DIAGNOSIS — E782 Mixed hyperlipidemia: Secondary | ICD-10-CM | POA: Diagnosis not present

## 2023-07-15 DIAGNOSIS — M5416 Radiculopathy, lumbar region: Secondary | ICD-10-CM | POA: Diagnosis not present

## 2023-07-15 DIAGNOSIS — E118 Type 2 diabetes mellitus with unspecified complications: Secondary | ICD-10-CM | POA: Diagnosis not present

## 2023-08-03 DIAGNOSIS — M5442 Lumbago with sciatica, left side: Secondary | ICD-10-CM | POA: Diagnosis not present

## 2023-08-03 DIAGNOSIS — G8929 Other chronic pain: Secondary | ICD-10-CM | POA: Diagnosis not present

## 2023-08-03 DIAGNOSIS — M5416 Radiculopathy, lumbar region: Secondary | ICD-10-CM | POA: Diagnosis not present

## 2023-08-24 DIAGNOSIS — M79605 Pain in left leg: Secondary | ICD-10-CM | POA: Diagnosis not present

## 2023-08-24 DIAGNOSIS — Z79899 Other long term (current) drug therapy: Secondary | ICD-10-CM | POA: Diagnosis not present

## 2023-08-24 DIAGNOSIS — E118 Type 2 diabetes mellitus with unspecified complications: Secondary | ICD-10-CM | POA: Diagnosis not present

## 2023-08-24 DIAGNOSIS — I10 Essential (primary) hypertension: Secondary | ICD-10-CM | POA: Diagnosis not present

## 2023-08-24 DIAGNOSIS — E7800 Pure hypercholesterolemia, unspecified: Secondary | ICD-10-CM | POA: Diagnosis not present

## 2023-08-24 DIAGNOSIS — E079 Disorder of thyroid, unspecified: Secondary | ICD-10-CM | POA: Diagnosis not present

## 2023-08-24 DIAGNOSIS — E559 Vitamin D deficiency, unspecified: Secondary | ICD-10-CM | POA: Diagnosis not present

## 2023-08-24 DIAGNOSIS — M79604 Pain in right leg: Secondary | ICD-10-CM | POA: Diagnosis not present

## 2023-08-30 DIAGNOSIS — M79604 Pain in right leg: Secondary | ICD-10-CM | POA: Diagnosis not present

## 2023-08-30 DIAGNOSIS — M79605 Pain in left leg: Secondary | ICD-10-CM | POA: Diagnosis not present

## 2023-09-22 IMAGING — MR MR LUMBAR SPINE W/O CM
5 series · 31 of 48 positions shown · non-contrast
Comparison: None.

CLINICAL DATA: Low back pain for 5 months.  No known injury.

EXAM:
MRI LUMBAR SPINE WITHOUT CONTRAST
TECHNIQUE: Multiplanar, multisequence MR imaging of the lumbar spine was
performed. No intravenous contrast was administered.

[Series 5: T2 · sagittal · 4.0mm · 0.81mm/px · 6 of 17 slices shown (1 of 2)]
[im 1/17]
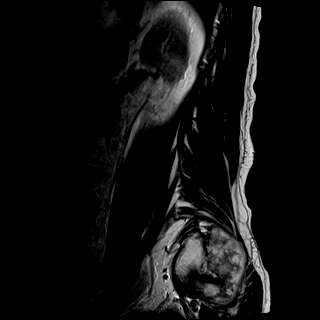
[im 4/17]
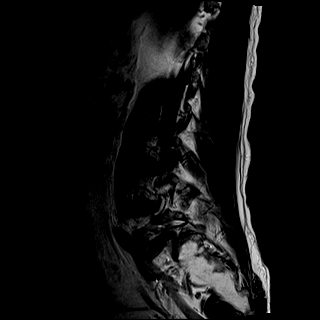
[im 7/17]
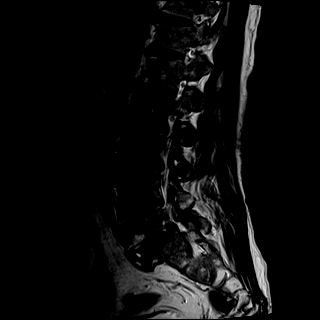
[im 10/17]
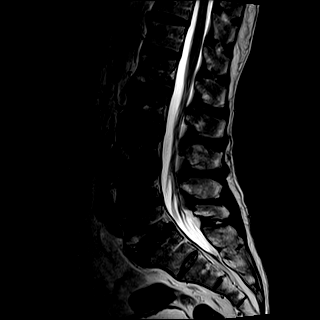
[im 13/17]
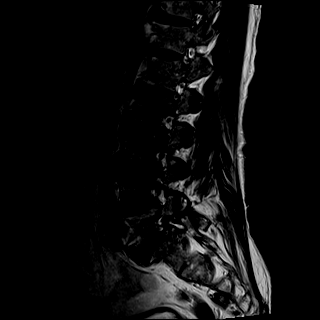
[im 17/17]
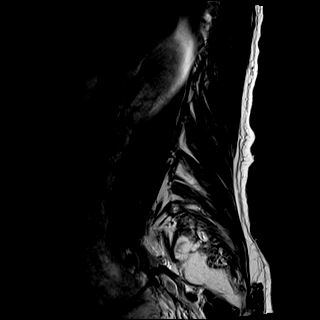

[Series 6: T1 · sagittal · 4.0mm · 0.81mm/px · 7 of 17 slices shown (1 of 2)]
[im 1/17]
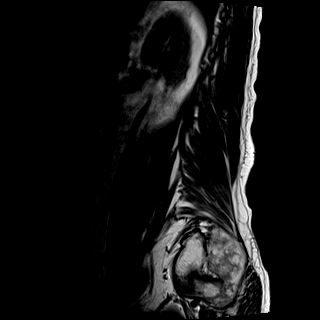
[im 3/17]
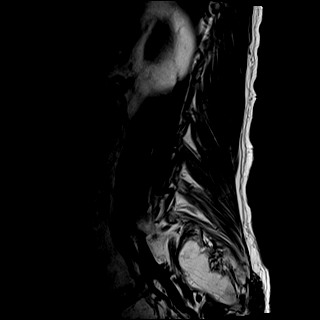
[im 6/17]
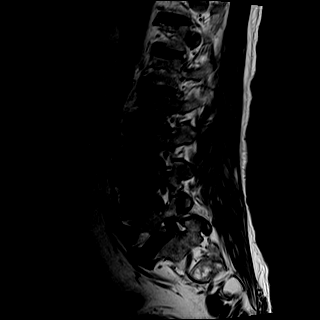
[im 9/17]
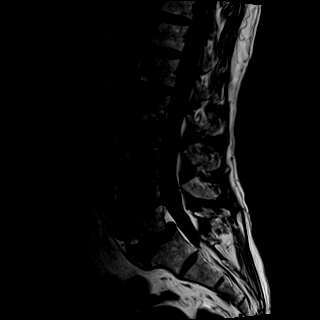
[im 11/17]
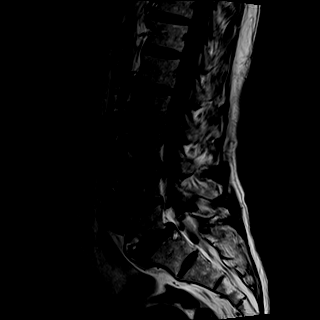
[im 14/17]
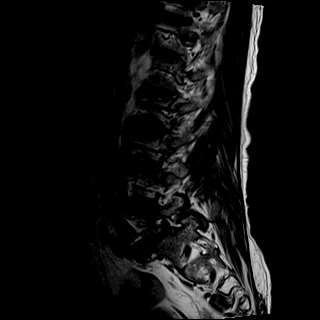
[im 17/17]
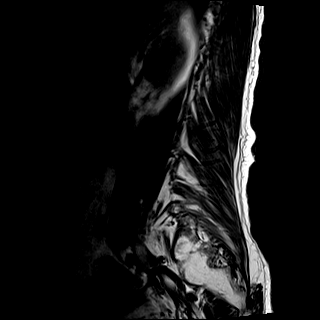

[Series 7: STIR · sagittal · 4.0mm · 0.41mm/px · 2 of 17 slices shown]
[im 1/17]
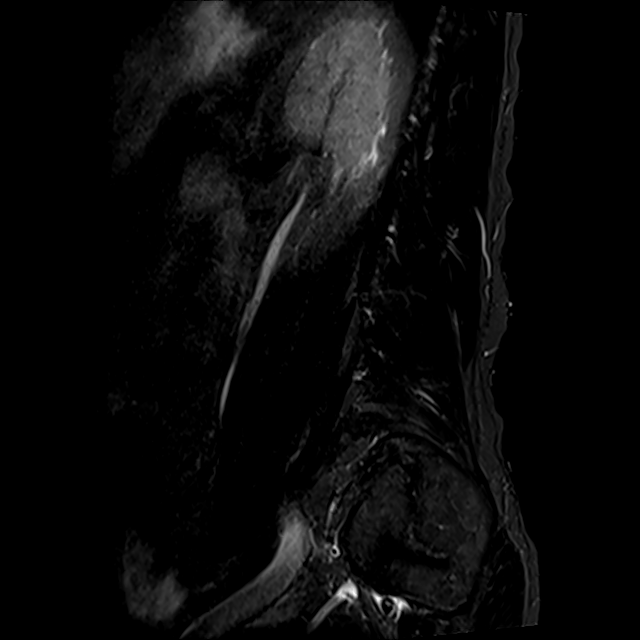
[im 3/17]
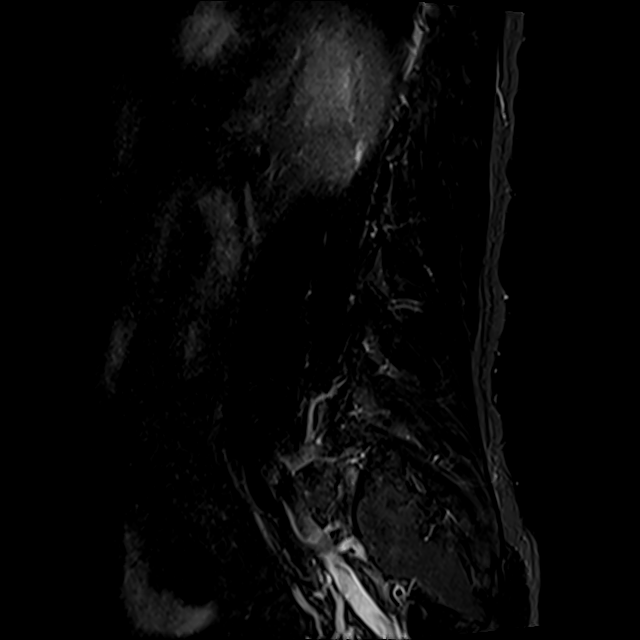

[Series 10: T2 · axial · 4.0mm · 0.78mm/px · z∈[-71,+150]mm · 8 of 37 slices shown (2 of 2)]
[im 1/37]
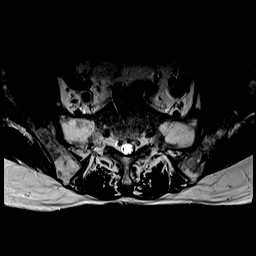
[im 6/37]
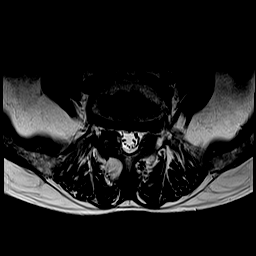
[im 12/37]
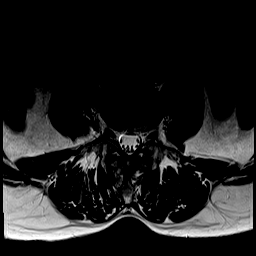
[im 17/37]
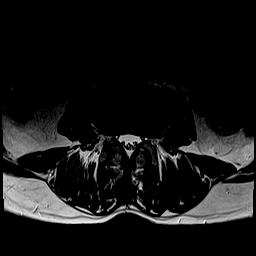
[im 20/37]
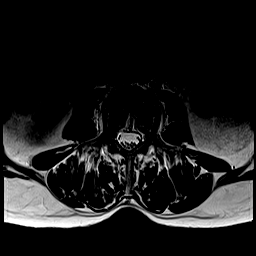
[im 25/37]
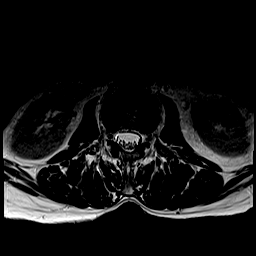
[im 31/37]
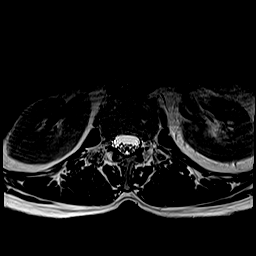
[im 37/37]
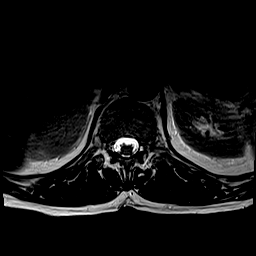

[Series 14: T1 · axial · 4.0mm · 0.39mm/px · z∈[-71,+150]mm · 8 of 37 slices shown (2 of 2)]
[im 1/37]
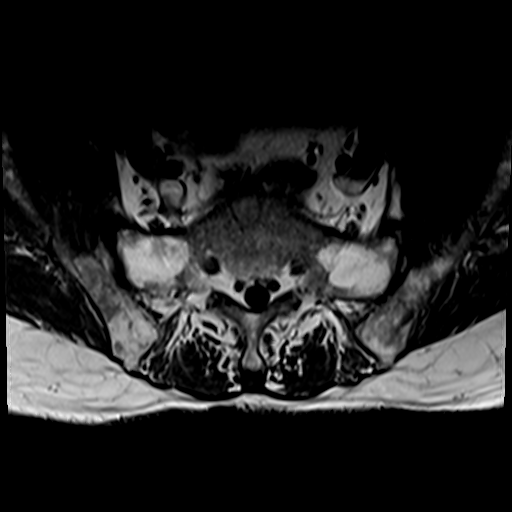
[im 6/37]
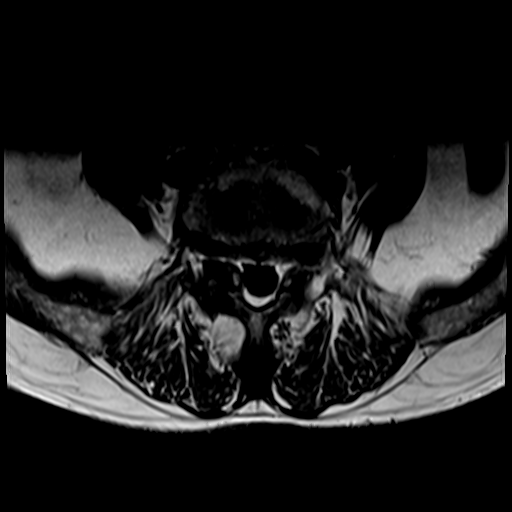
[im 12/37]
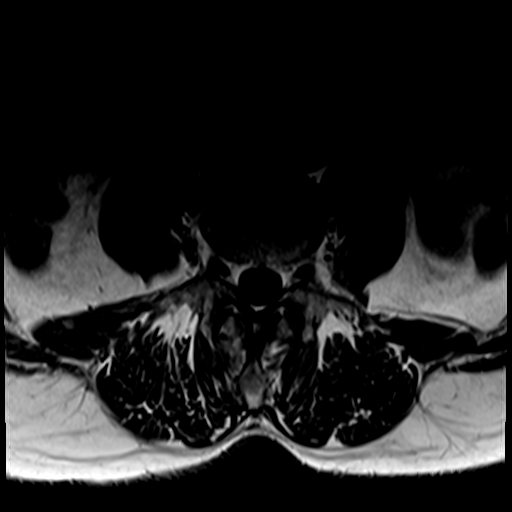
[im 17/37]
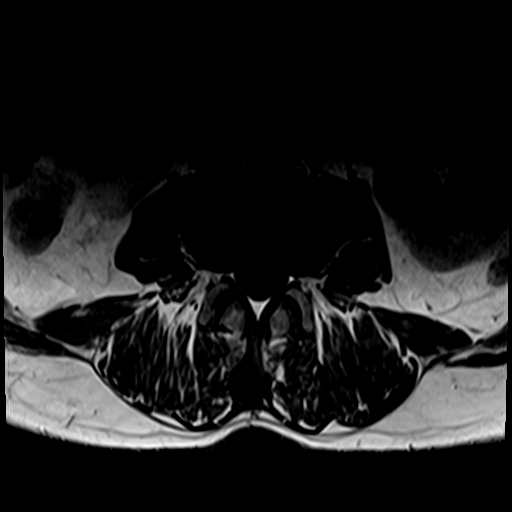
[im 20/37]
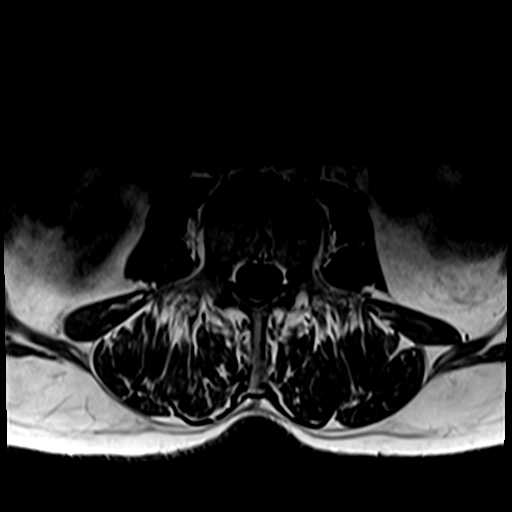
[im 25/37]
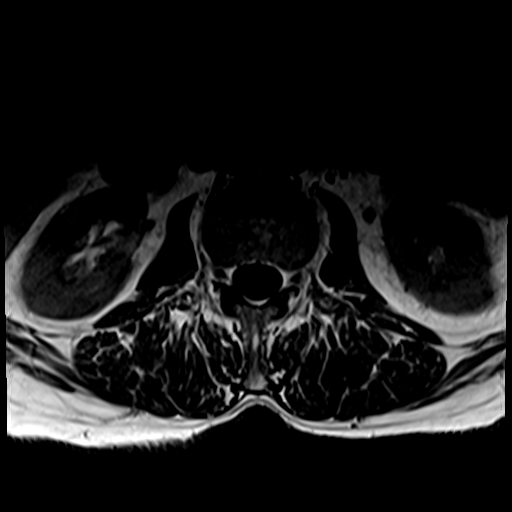
[im 31/37]
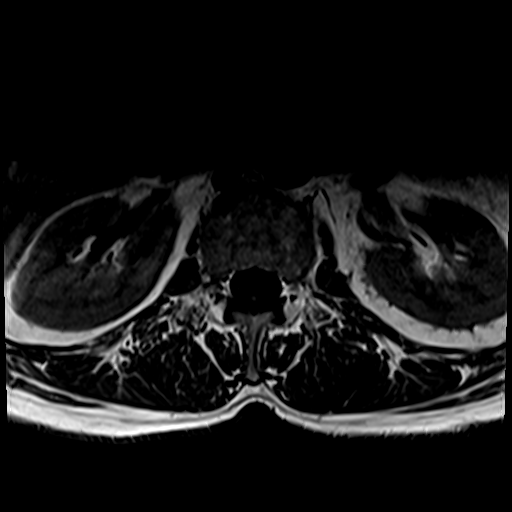
[im 37/37]
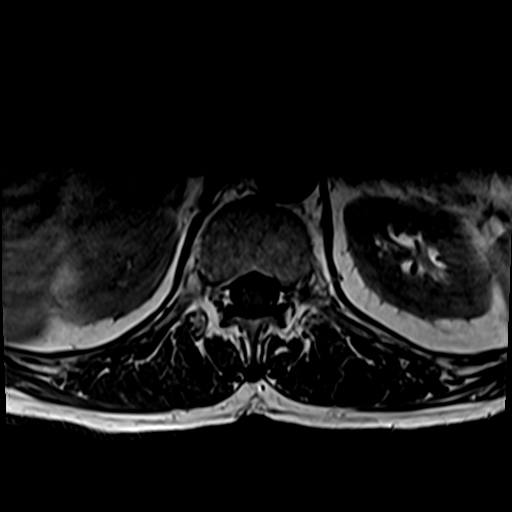

[31 of 48 positions shown; findings below may reference images not displayed]

FINDINGS: Segmentation:  Standard.

Alignment: Grade 1 anterolisthesis of L5 on S1 second bilateral L5
pars interarticularis defects.

Vertebrae: No acute fracture, evidence of discitis, or aggressive
bone lesion.

Conus medullaris and cauda equina: Conus extends to the T12-L1
level. Conus and cauda equina appear normal.

Paraspinal and other soft tissues: No acute paraspinal abnormality.

Disc levels:

Disc spaces: Disc desiccation throughout the lumbar spine.

T12-L1: No significant disc bulge. No neural foraminal stenosis. No
central canal stenosis.

L1-L2: No significant disc bulge. No neural foraminal stenosis. No
central canal stenosis.

L2-L3: No significant disc bulge. No neural foraminal stenosis. No
central canal stenosis.

L3-L4: Minimal broad-based disc bulge. Mild bilateral facet
arthropathy. No foraminal or central canal stenosis.

L4-L5: No significant disc bulge. No neural foraminal stenosis. No
central canal stenosis. Mild bilateral facet arthropathy.

L5-S1: Mild broad-based disc bulge. Moderate right and severe left
foraminal stenosis. No spinal stenosis.

SI joint: Mild osteoarthritis of bilateral SI joints.
IMPRESSION: 1.  No acute osseous injury of the lumbar spine.
2. Mild lower lumbar spine spondylosis as described above.

## 2023-10-26 ENCOUNTER — Other Ambulatory Visit (INDEPENDENT_AMBULATORY_CARE_PROVIDER_SITE_OTHER): Payer: Self-pay | Admitting: Vascular Surgery

## 2023-10-26 DIAGNOSIS — I739 Peripheral vascular disease, unspecified: Secondary | ICD-10-CM

## 2023-10-27 ENCOUNTER — Encounter (INDEPENDENT_AMBULATORY_CARE_PROVIDER_SITE_OTHER): Payer: Self-pay | Admitting: Vascular Surgery

## 2023-10-27 ENCOUNTER — Ambulatory Visit (INDEPENDENT_AMBULATORY_CARE_PROVIDER_SITE_OTHER): Payer: Self-pay | Admitting: Vascular Surgery

## 2023-10-27 ENCOUNTER — Ambulatory Visit (INDEPENDENT_AMBULATORY_CARE_PROVIDER_SITE_OTHER): Payer: Self-pay

## 2023-10-27 VITALS — BP 125/69 | HR 85 | Resp 18 | Ht 62.0 in | Wt 133.0 lb

## 2023-10-27 DIAGNOSIS — I739 Peripheral vascular disease, unspecified: Secondary | ICD-10-CM

## 2023-10-27 DIAGNOSIS — E039 Hypothyroidism, unspecified: Secondary | ICD-10-CM | POA: Diagnosis not present

## 2023-10-27 DIAGNOSIS — I70221 Atherosclerosis of native arteries of extremities with rest pain, right leg: Secondary | ICD-10-CM

## 2023-10-27 DIAGNOSIS — I10 Essential (primary) hypertension: Secondary | ICD-10-CM

## 2023-10-27 NOTE — H&P (View-Only) (Signed)
 MRN : [id number]  Tony Solomon is a 73 y.o. (22-Aug-1950) male who presents with chief complaint of check circulation.  History of Present Illness:  The patient presents to the office for evaluation of atherosclerotic changes of the lower extremities and review of the noninvasive studies.   The patient notes that there has been a significant deterioration in the lower extremity symptoms.  The patient notes interval shortening of their claudication distance and development of severe right rest pain symptoms. No new ulcers or wounds have occurred since the last visit.  There have been no significant changes to the patient's overall health care.  The patient denies amaurosis fugax or recent TIA symptoms. There are no recent neurological changes noted. There is no history of DVT, PE or superficial thrombophlebitis. The patient denies recent episodes of angina or shortness of breath.   ABI's Rt=0.71 biphasic and Lt=0.83    No outpatient medications have been marked as taking for the 10/27/23 encounter (Appointment) with Prescilla Brod, Ninette Basque, MD.    No past medical history on file.    Social History    Family History No family history on file.  Not on File   REVIEW OF SYSTEMS (Negative unless checked)  Constitutional: [] Weight loss  [] Fever  [] Chills Cardiac: [] Chest pain   [] Chest pressure   [] Palpitations   [] Shortness of breath when laying flat   [] Shortness of breath with exertion. Vascular:  [x] Pain in legs with walking   [x] Pain in legs at rest  [] History of DVT   [] Phlebitis   [] Swelling in legs   [] Varicose veins   [] Non-healing ulcers Pulmonary:   [] Uses home oxygen   [] Productive cough   [] Hemoptysis   [] Wheeze  [] COPD   [] Asthma Neurologic:  [] Dizziness   [] Seizures   [] History of stroke   [] History of TIA  [] Aphasia   [] Vissual changes   [] Weakness or numbness in arm   [] Weakness or numbness in  leg Musculoskeletal:   [] Joint swelling   [] Joint pain   [] Low back pain Hematologic:  [] Easy bruising  [] Easy bleeding   [] Hypercoagulable state   [] Anemic Gastrointestinal:  [] Diarrhea   [] Vomiting  [] Gastroesophageal reflux/heartburn   [] Difficulty swallowing. Genitourinary:  [] Chronic kidney disease   [] Difficult urination  [] Frequent urination   [] Blood in urine Skin:  [] Rashes   [] Ulcers  Psychological:  [] History of anxiety   []  History of major depression.  Physical Examination  There were no vitals filed for this visit. There is no height or weight on file to calculate BMI. Gen: WD/WN, NAD Head: [place]/AT, No temporalis wasting.  Ear/Nose/Throat: Hearing grossly intact, nares w/o erythema or drainage Eyes: PER, EOMI, sclera nonicteric.  Neck: Supple, no masses.  No bruit or JVD.  Pulmonary:  Good air movement, no audible wheezing, no use of accessory muscles.  Cardiac: RRR, normal S1, S2, no Murmurs. Vascular:  mild trophic changes, no open wounds Vessel Right Left  Radial Palpable Palpable  PT Not Palpable Not Palpable  DP Not Palpable Not Palpable  Gastrointestinal: soft, non-distended. No guarding/no peritoneal signs.  Musculoskeletal: M/S 5/5 throughout.  No  visible deformity.  Neurologic: CN 2-12 intact. Pain and light touch intact in extremities.  Symmetrical.  Speech is fluent. Motor exam as listed above. Psychiatric: Judgment intact, Mood & affect appropriate for pt's clinical situation. Dermatologic: No rashes or ulcers noted.  No changes consistent with cellulitis.   CBC No results found for: "WBC", "HGB", "HCT", "MCV", "PLT"  BMET No results found for: "NA", "K", "CL", "CO2", "GLUCOSE", "BUN", "CREATININE", "CALCIUM", "GFRNONAA", "GFRAA" CrCl cannot be calculated (No successful lab value found.).  COAG No results found for: "INR", "PROTIME"  Radiology No results found.   Assessment/Plan 1. 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 with  rest pain (HCC) (Primary) Recommend:  The patient has evidence of severe atherosclerotic changes of both lower extremities with rest pain that is associated with preulcerative changes and impending tissue loss of the right foot.  This represents a limb threatening ischemia and places the patient at the risk for right limb loss.  Patient should undergo angiography of the right lower extremity with the hope for intervention for limb salvage.  The risks and benefits as well as the alternative therapies was discussed in detail with the patient.  All questions were answered.  Patient agrees to proceed with right lower extremity angiography.  The patient will follow up with me in the office after the procedure.   I70.229    Atherosclerotic occlusive disease with rest pain  CPT codes: 40981   stent placement femoral-popliteal artery 36247   introduction catheter below diaphragm third order  2. Essential hypertension Continue antihypertensive medications as already ordered, these medications have been reviewed and there are no changes at this time.  3. Hypothyroidism, unspecified type Continue hormone replacement as ordered and reviewed, no changes at this time    Devon Fogo, MD  10/27/2023 8:56 AM

## 2023-10-27 NOTE — Progress Notes (Unsigned)
 MRN : [id number]  Tony Solomon is a 73 y.o. (1951-01-24) male who presents with chief complaint of check circulation.  History of Present Illness:  The patient presents to the office for evaluation of atherosclerotic changes of the lower extremities and review of the noninvasive studies.   The patient notes that there has been a significant deterioration in the lower extremity symptoms.  The patient notes interval shortening of their claudication distance and development of severe right rest pain symptoms. No new ulcers or wounds have occurred since the last visit.  There have been no significant changes to the patient's overall health care.  The patient denies amaurosis fugax or recent TIA symptoms. There are no recent neurological changes noted. There is no history of DVT, PE or superficial thrombophlebitis. The patient denies recent episodes of angina or shortness of breath.   ABI's Rt=0.71 biphasic and Lt=0.83    No outpatient medications have been marked as taking for the 10/27/23 encounter (Appointment) with Prescilla Brod, Ninette Basque, MD.    No past medical history on file.    Social History    Family History No family history on file.  Not on File   REVIEW OF SYSTEMS (Negative unless checked)  Constitutional: [] Weight loss  [] Fever  [] Chills Cardiac: [] Chest pain   [] Chest pressure   [] Palpitations   [] Shortness of breath when laying flat   [] Shortness of breath with exertion. Vascular:  [x] Pain in legs with walking   [x] Pain in legs at rest  [] History of DVT   [] Phlebitis   [] Swelling in legs   [] Varicose veins   [] Non-healing ulcers Pulmonary:   [] Uses home oxygen   [] Productive cough   [] Hemoptysis   [] Wheeze  [] COPD   [] Asthma Neurologic:  [] Dizziness   [] Seizures   [] History of stroke   [] History of TIA  [] Aphasia   [] Vissual changes   [] Weakness or numbness in arm   [] Weakness or numbness in  leg Musculoskeletal:   [] Joint swelling   [] Joint pain   [] Low back pain Hematologic:  [] Easy bruising  [] Easy bleeding   [] Hypercoagulable state   [] Anemic Gastrointestinal:  [] Diarrhea   [] Vomiting  [] Gastroesophageal reflux/heartburn   [] Difficulty swallowing. Genitourinary:  [] Chronic kidney disease   [] Difficult urination  [] Frequent urination   [] Blood in urine Skin:  [] Rashes   [] Ulcers  Psychological:  [] History of anxiety   []  History of major depression.  Physical Examination  There were no vitals filed for this visit. There is no height or weight on file to calculate BMI. Gen: WD/WN, NAD Head: [place]/AT, No temporalis wasting.  Ear/Nose/Throat: Hearing grossly intact, nares w/o erythema or drainage Eyes: PER, EOMI, sclera nonicteric.  Neck: Supple, no masses.  No bruit or JVD.  Pulmonary:  Good air movement, no audible wheezing, no use of accessory muscles.  Cardiac: RRR, normal S1, S2, no Murmurs. Vascular:  mild trophic changes, no open wounds Vessel Right Left  Radial Palpable Palpable  PT Not Palpable Not Palpable  DP Not Palpable Not Palpable  Gastrointestinal: soft, non-distended. No guarding/no peritoneal signs.  Musculoskeletal: M/S 5/5 throughout.  No  visible deformity.  Neurologic: CN 2-12 intact. Pain and light touch intact in extremities.  Symmetrical.  Speech is fluent. Motor exam as listed above. Psychiatric: Judgment intact, Mood & affect appropriate for pt's clinical situation. Dermatologic: No rashes or ulcers noted.  No changes consistent with cellulitis.   CBC No results found for: "WBC", "HGB", "HCT", "MCV", "PLT"  BMET No results found for: "NA", "K", "CL", "CO2", "GLUCOSE", "BUN", "CREATININE", "CALCIUM", "GFRNONAA", "GFRAA" CrCl cannot be calculated (No successful lab value found.).  COAG No results found for: "INR", "PROTIME"  Radiology No results found.   Assessment/Plan 1. 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 with  rest pain (HCC) (Primary) Recommend:  The patient has evidence of severe atherosclerotic changes of both lower extremities with rest pain that is associated with preulcerative changes and impending tissue loss of the right foot.  This represents a limb threatening ischemia and places the patient at the risk for right limb loss.  Patient should undergo angiography of the right lower extremity with the hope for intervention for limb salvage.  The risks and benefits as well as the alternative therapies was discussed in detail with the patient.  All questions were answered.  Patient agrees to proceed with right lower extremity angiography.  The patient will follow up with me in the office after the procedure.   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 Essential hypertension Continue antihypertensive medications as already ordered, these medications have been reviewed and there are no changes at this time.  3. Hypothyroidism, unspecified type Continue hormone replacement as ordered and reviewed, no changes at this time    Devon Fogo, MD  10/27/2023 8:56 AM

## 2023-10-28 ENCOUNTER — Encounter (INDEPENDENT_AMBULATORY_CARE_PROVIDER_SITE_OTHER): Payer: Self-pay | Admitting: Vascular Surgery

## 2023-10-28 DIAGNOSIS — I70229 Atherosclerosis of native arteries of extremities with rest pain, unspecified extremity: Secondary | ICD-10-CM | POA: Insufficient documentation

## 2023-10-28 DIAGNOSIS — I10 Essential (primary) hypertension: Secondary | ICD-10-CM | POA: Insufficient documentation

## 2023-10-28 DIAGNOSIS — E039 Hypothyroidism, unspecified: Secondary | ICD-10-CM | POA: Insufficient documentation

## 2023-11-07 ENCOUNTER — Telehealth (INDEPENDENT_AMBULATORY_CARE_PROVIDER_SITE_OTHER): Payer: Self-pay

## 2023-11-07 NOTE — Telephone Encounter (Signed)
 Spoke with the patient and he is scheduled with Dr. Prescilla Brod for a right leg angio on 11/08/23 with a 6:45 am arrival time to the [facility]. Pre-procedure instructions were discussed and patient stated he understood.

## 2023-11-08 ENCOUNTER — Ambulatory Visit
Admission: RE | Admit: 2023-11-08 | Discharge: 2023-11-08 | Disposition: A | Discharge status: Home / Self Care | Attending: Vascular Surgery | Admitting: Vascular Surgery

## 2023-11-08 ENCOUNTER — Other Ambulatory Visit: Payer: Self-pay

## 2023-11-08 ENCOUNTER — Ambulatory Visit

## 2023-11-08 ENCOUNTER — Encounter
Admission: RE | Disposition: A | Discharge status: Home / Self Care | Payer: Self-pay | Source: Home / Self Care | Attending: Vascular Surgery

## 2023-11-08 ENCOUNTER — Encounter: Payer: Self-pay | Admitting: Vascular Surgery

## 2023-11-08 DIAGNOSIS — Z79890 Hormone replacement therapy (postmenopausal): Secondary | ICD-10-CM | POA: Diagnosis not present

## 2023-11-08 DIAGNOSIS — K760 Fatty (change of) liver, not elsewhere classified: Secondary | ICD-10-CM | POA: Insufficient documentation

## 2023-11-08 DIAGNOSIS — I70229 Atherosclerosis of native arteries of extremities with rest pain, unspecified extremity: Secondary | ICD-10-CM

## 2023-11-08 DIAGNOSIS — I10 Essential (primary) hypertension: Secondary | ICD-10-CM | POA: Insufficient documentation

## 2023-11-08 DIAGNOSIS — I745 Embolism and thrombosis of iliac artery: Secondary | ICD-10-CM | POA: Diagnosis not present

## 2023-11-08 DIAGNOSIS — K449 Diaphragmatic hernia without obstruction or gangrene: Secondary | ICD-10-CM | POA: Diagnosis not present

## 2023-11-08 DIAGNOSIS — E039 Hypothyroidism, unspecified: Secondary | ICD-10-CM | POA: Insufficient documentation

## 2023-11-08 DIAGNOSIS — I70223 Atherosclerosis of native arteries of extremities with rest pain, bilateral legs: Secondary | ICD-10-CM | POA: Diagnosis not present

## 2023-11-08 DIAGNOSIS — I700 Atherosclerosis of aorta: Secondary | ICD-10-CM

## 2023-11-08 HISTORY — PX: LOWER EXTREMITY ANGIOGRAPHY: CATH118251

## 2023-11-08 HISTORY — PX: LOWER EXTREMITY INTERVENTION: CATH118252

## 2023-11-08 LAB — CREATININE, SERUM
Creatinine, Ser: 0.78 mg/dL (ref 0.61–1.24)
GFR, Estimated: >60 mL/min (ref >60)

## 2023-11-08 LAB — BUN: BUN: 9 mg/dL (ref 8–23)

## 2023-11-08 SURGERY — LOWER EXTREMITY INTERVENTION
Anesthesia: Moderate Sedation | Site: Leg Lower | Laterality: Right

## 2023-11-08 MED ORDER — ONDANSETRON HCL 4 MG/2ML IJ SOLN: 4.0000 mg | Freq: Four times a day (QID) | INTRAMUSCULAR | Status: DC | PRN

## 2023-11-08 MED ORDER — METHYLPREDNISOLONE SODIUM SUCC 125 MG IJ SOLR: 125.0000 mg | Freq: Once | INTRAMUSCULAR | Status: DC | PRN

## 2023-11-08 MED ORDER — SODIUM CHLORIDE 0.9 % IV SOLN: 250.0000 mL | INTRAVENOUS | Status: DC | PRN

## 2023-11-08 MED ORDER — HEPARIN SODIUM (PORCINE) 1000 UNIT/ML IJ SOLN
INTRAMUSCULAR | Status: DC | PRN
Start: 2023-11-08 — End: 2023-11-08
  Administered 2023-11-08: 6000 [IU] via INTRAVENOUS

## 2023-11-08 MED ORDER — CEFAZOLIN SODIUM-DEXTROSE 2-4 GM/100ML-% IV SOLN
INTRAVENOUS | Status: AC
  Filled 2023-11-08: qty 100

## 2023-11-08 MED ORDER — SODIUM CHLORIDE 0.9% FLUSH: 3.0000 mL | INTRAVENOUS | Status: DC | PRN

## 2023-11-08 MED ORDER — FENTANYL CITRATE (PF) 100 MCG/2ML IJ SOLN
INTRAMUSCULAR | Status: DC | PRN
Start: 2023-11-08 — End: 2023-11-08
  Administered 2023-11-08 (×3): 25 ug via INTRAVENOUS
  Administered 2023-11-08: 50 ug via INTRAVENOUS

## 2023-11-08 MED ORDER — MIDAZOLAM HCL 2 MG/ML PO SYRP: 8.0000 mg | ORAL_SOLUTION | Freq: Once | ORAL | Status: DC | PRN

## 2023-11-08 MED ORDER — IODIXANOL 320 MG/ML IV SOLN
INTRAVENOUS | Status: DC | PRN
  Administered 2023-11-08: 70 mL

## 2023-11-08 MED ORDER — LIDOCAINE HCL (PF) 1 % IJ SOLN
INTRAMUSCULAR | Status: DC | PRN
Start: 2023-11-08 — End: 2023-11-08
  Administered 2023-11-08: 10 mL

## 2023-11-08 MED ORDER — MORPHINE SULFATE (PF) 4 MG/ML IV SOLN: 2.0000 mg | INTRAVENOUS | Status: DC | PRN

## 2023-11-08 MED ORDER — CEFAZOLIN SODIUM-DEXTROSE 2-4 GM/100ML-% IV SOLN
2.0000 g | INTRAVENOUS | Status: AC
  Administered 2023-11-08: 2 g via INTRAVENOUS

## 2023-11-08 MED ORDER — DIPHENHYDRAMINE HCL 50 MG/ML IJ SOLN
50.0000 mg | Freq: Once | INTRAMUSCULAR | Status: DC | PRN
Start: 2023-11-08 — End: 2023-11-08

## 2023-11-08 MED ORDER — SODIUM CHLORIDE 0.9% FLUSH: 3.0000 mL | Freq: Two times a day (BID) | INTRAVENOUS | Status: DC

## 2023-11-08 MED ORDER — MIDAZOLAM HCL 5 MG/5ML IJ SOLN
INTRAMUSCULAR | Status: AC
  Filled 2023-11-08: qty 5

## 2023-11-08 MED ORDER — FENTANYL CITRATE (PF) 100 MCG/2ML IJ SOLN
INTRAMUSCULAR | Status: AC
  Filled 2023-11-08: qty 2

## 2023-11-08 MED ORDER — HEPARIN (PORCINE) IN NACL 1000-0.9 UT/500ML-% IV SOLN
INTRAVENOUS | Status: DC | PRN
  Administered 2023-11-08: 1000 mL

## 2023-11-08 MED ORDER — FAMOTIDINE 20 MG PO TABS: 40.0000 mg | ORAL_TABLET | Freq: Once | ORAL | Status: DC | PRN

## 2023-11-08 MED ORDER — MIDAZOLAM HCL 2 MG/2ML IJ SOLN
INTRAMUSCULAR | Status: DC | PRN
  Administered 2023-11-08: .5 mg via INTRAVENOUS
  Administered 2023-11-08 (×3): 1 mg via INTRAVENOUS
  Administered 2023-11-08: .5 mg via INTRAVENOUS

## 2023-11-08 MED ORDER — SODIUM CHLORIDE 0.9 % IV SOLN: INTRAVENOUS | Status: DC

## 2023-11-08 MED ORDER — HEPARIN SODIUM (PORCINE) 1000 UNIT/ML IJ SOLN
INTRAMUSCULAR | Status: AC
  Filled 2023-11-08: qty 10

## 2023-11-08 MED ORDER — FENTANYL CITRATE (PF) 100 MCG/2ML IJ SOLN
INTRAMUSCULAR | Status: AC
Start: 2023-11-08 — End: ?
  Filled 2023-11-08: qty 2

## 2023-11-08 MED ORDER — HYDROMORPHONE HCL 1 MG/ML IJ SOLN: 1.0000 mg | Freq: Once | INTRAMUSCULAR | Status: DC | PRN

## 2023-11-08 MED ORDER — OXYCODONE HCL 5 MG PO TABS: 5.0000 mg | ORAL_TABLET | ORAL | Status: DC | PRN

## 2023-11-08 SURGICAL SUPPLY — 13 items
CATH ANGIO 5F PIGTAIL 65CM (CATHETERS) IMPLANT
CATH BEACON 5 .035 40 KMP TP (CATHETERS) IMPLANT
COVER PROBE ULTRASOUND 5X96 (MISCELLANEOUS) IMPLANT
DEVICE STARCLOSE SE CLOSURE (Vascular Products) IMPLANT
GOWN STRL REUS W/ TWL LRG LVL3 (GOWN DISPOSABLE) ×1 IMPLANT
NDL ENTRY 21GA 7CM ECHOTIP (NEEDLE) IMPLANT
NEEDLE ENTRY 21GA 7CM ECHOTIP (NEEDLE) ×1 IMPLANT
PACK ANGIOGRAPHY (CUSTOM PROCEDURE TRAY) ×1 IMPLANT
SET INTRO CAPELLA COAXIAL (SET/KITS/TRAYS/PACK) IMPLANT
SHEATH BRITE TIP 5FRX11 (SHEATH) IMPLANT
SYR MEDRAD MARK 7 150ML (SYRINGE) IMPLANT
TUBING CONTRAST HIGH PRESS 72 (TUBING) IMPLANT
WIRE J 3MM .035X145CM (WIRE) IMPLANT

## 2023-11-08 NOTE — Op Note (Signed)
 [facility]  Percutaneous Study/Intervention Procedural Note   Date of Surgery: 11/08/2023,10:07 AM  Surgeon:Jex Strausbaugh, Ninette Basque   Pre-operative Diagnosis: Atherosclerotic occlusive disease bilateral lower extremities with rest pain  Post-operative diagnosis:  Same  Procedure(s) Performed:  1.  Abdominal aortogram  2.  Bilateral lower extremity distal runoff  3.  Ultrasound-guided access to the left common femoral artery  4.  StarClose left femoral artery    Anesthesia: Conscious sedation was administered by the interventional radiology RN under my direct supervision. IV Versed plus fentanyl were utilized. Continuous ECG, pulse oximetry and blood pressure was monitored throughout the entire procedure.  Conscious sedation was administered for a total of 40 minutes.  Sheath: 5 French 11 cm Pinnacle sheath retrograde left common femoral  Contrast: 70 cc   Fluoroscopy Time: 1.5 minutes  Indications:  The patient presents to [facility] with atherosclerotic occlusive disease bilateral lower extremities with bilateral lower extremity rest pain.  Pedal pulses are nonpalpable bilaterally suggesting hemodynamically significant atherosclerotic occlusive disease.  The risks and benefits as well as alternative therapies for lower extremity revascularization are reviewed with the patient all questions are answered the patient agrees to proceed.  The patient is therefore undergoing angiography with the hope for intervention for limb salvage.   Procedure:  Tony Gelles Johnsonis a 73 y.o. male who was identified and appropriate procedural time out was performed.  The patient was then placed supine on the table and prepped and draped in the usual sterile fashion.  Ultrasound was used to evaluate the left common femoral artery.  It was echolucent and pulsatile indicating it is patent .  An ultrasound image was acquired for the permanent record.  A micropuncture needle was used  to access the left common femoral artery under direct ultrasound guidance.  The microwire was then advanced under fluoroscopic guidance without difficulty followed by the micro-sheath.  A 0.035 J wire was advanced without resistance and a 5Fr sheath was placed.    Pigtail catheter was then advanced to the level of T12 and AP projection of the aorta was obtained. Pigtail catheter was then repositioned to above the bifurcation and bilateral oblique view of the pelvis was obtained.  Detector was then positioned to the AP.  Distal runoff was then performed.  After review of the images the catheter was removed over wire and an LAO view of the groin was obtained. StarClose device was deployed without difficulty.   Findings:   Aortogram: The abdominal aorta is opacified with a bolus injection contrast.  Single renal arteries are noted bilaterally with normal nephrograms.  The distal aorta demonstrates profound calcific disease with an occlusion at the bifurcation.  The common iliac arteries are both occluded from the origin distally there appears to be reconstitution of both external iliac arteries via the internal iliac arteries secondary to lumbar collaterals.  Right lower Extremity: The right common femoral is essentially occluded beginning in the distal external iliac artery throughout its entirety.  There is reconstitution of the femoral bifurcation.  Pelvic collaterals filled the profunda femoris which then fills the SFA.  SFA and popliteal are widely patent and free of clinically significant disease.  Trifurcation is patent.               Right lower Extremity: The right common femoral is essentially occluded beginning in the proximal common femoral artery throughout its entirety.  There is reconstitution of the femoral bifurcation.  Pelvic collaterals filled the profunda femoris which then fills  the SFA.  SFA and popliteal are widely patent and free of clinically significant disease.  Trifurcation is  patent.  SUMMARY: Based on these images no intervention is performed at this time.    Disposition: Patient was taken to the recovery room in stable condition having tolerated the procedure well.  Tony Solomon 11/08/2023,10:07 AM

## 2023-11-08 NOTE — Interval H&P Note (Signed)
 History and Physical Interval Note:  11/08/2023 8:51 AM  Tony Solomon  has presented today for surgery, with the diagnosis of RLE Angio   ASO w rest pain.  The various methods of treatment have been discussed with the patient and family. After consideration of risks, benefits and other options for treatment, the patient has consented to  Procedure(s): LOWER EXTREMITY INTERVENTION (Right) Lower Extremity Angiography (Right) as a surgical intervention.  The patient's history has been reviewed, patient examined, no change in status, stable for surgery.  I have reviewed the patient's chart and labs.  Questions were answered to the patient's satisfaction.     Devon Fogo

## 2023-11-22 ENCOUNTER — Encounter (INDEPENDENT_AMBULATORY_CARE_PROVIDER_SITE_OTHER): Payer: Self-pay | Admitting: Nurse Practitioner

## 2023-11-22 ENCOUNTER — Ambulatory Visit (INDEPENDENT_AMBULATORY_CARE_PROVIDER_SITE_OTHER): Admitting: Nurse Practitioner

## 2023-11-22 VITALS — BP 149/64 | HR 80 | Resp 16 | Wt 133.0 lb

## 2023-11-22 DIAGNOSIS — I70221 Atherosclerosis of native arteries of extremities with rest pain, right leg: Secondary | ICD-10-CM

## 2023-11-22 DIAGNOSIS — I10 Essential (primary) hypertension: Secondary | ICD-10-CM

## 2023-11-22 NOTE — Progress Notes (Unsigned)
 Subjective:    Patient ID: Tony Solomon, male    DOB: 05/21/1951, 73 y.o.   MRN: [id number] Chief Complaint  Patient presents with   Follow-up    [facility] 2 week follow up    The patient returns to the office for followup and review status post angiogram with intervention on 11/08/2023.   This procedure was largely diagnostic.  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 The right common femoral is essentially occluded beginning in the proximal common femoral artery throughout its entirety.   The patient notes no improvement in the lower extremity symptoms.  He has significant claudication with mild rest pain symptoms.  There have been no significant changes to the patient's overall health care.  No documented history of amaurosis fugax or recent TIA symptoms. There are no recent neurological changes noted. No documented history of DVT, PE or superficial thrombophlebitis. The patient denies recent episodes of angina or shortness of breath.       Review of Systems  All other systems reviewed and are negative.      Objective:   Physical Exam Vitals reviewed.  HENT:     Head: Normocephalic.   Cardiovascular:     Rate and Rhythm: Normal rate.  Pulmonary:     Effort: Pulmonary effort is normal.   Skin:    General: Skin is warm and dry.   Neurological:     Mental Status: He is alert and oriented to person, place, and time.   Psychiatric:        Mood and Affect: Mood normal.        Behavior: Behavior normal.        Thought Content: Thought content normal.        Judgment: Judgment normal.      BP (!) 149/64   Pulse 80   Resp 16   Wt 133 lb (60.3 kg)   BMI 24.33 kg/m   Past Medical History:  Diagnosis Date   Hyperlipidemia    Hypertension    Thyroid  disease     Social History   Socioeconomic History   Marital status: Married    Spouse name: Not on file   Number of children: Not on file   Years of education: Not on file   Highest education level: Not on file  Occupational History   Not on file  Tobacco Use   Smoking status: Former    Types: Cigarettes    Start date: 1998   Smokeless tobacco: Not on file  Substance and Sexual Activity   Alcohol use: Yes    Alcohol/week: 25.0 standard drinks of alcohol    Types: 25 Cans of beer per week   Drug use: Not on file   Sexual activity: Not on file  Other Topics Concern   Not on file  Social History Narrative   Not on file   Social Drivers of Health   Financial Resource Strain: Not on file  Food Insecurity: Not on file  Transportation Needs: Not on file  Physical Activity: Not on file  Stress: Not on file  Social Connections: Not on file  Intimate Partner Violence: Not on file  Past Surgical History:  Procedure Laterality Date   APPENDECTOMY     LOWER EXTREMITY ANGIOGRAPHY Right 11/08/2023   Procedure: Lower Extremity Angiography;  Surgeon: Tony Cordella MATSU, MD;  Location: [facility];  Service: Cardiovascular;  Laterality: Right;   LOWER EXTREMITY INTERVENTION Right 11/08/2023   Procedure: LOWER EXTREMITY INTERVENTION;  Surgeon: Tony Cordella MATSU, MD;  Location: [facility];  Service: Cardiovascular;  Laterality: Right;    Family History  Problem Relation Age of Onset   Cancer Mother    Heart disease Brother     No Known Allergies      No data to display            CMP     Component Value Date/Time   BUN 9 11/08/2023 0744   CREATININE 0.78 11/08/2023 0744   GFRNONAA >60 11/08/2023 0744     VAS US  ABI WITH/WO TBI Result Date: 10/27/2023  LOWER EXTREMITY  DOPPLER STUDY Patient Name:  Tony Solomon  Date of Exam:   10/27/2023 Medical Rec #: [id number]         Accession #:    [id number] Date of Birth: [birth year]         Patient Gender: M Patient Age:   85 years Exam Location:  [facility] Procedure:      VAS US  ABI WITH/WO TBI Referring Phys: --------------------------------------------------------------------------------  Indications: Claudication.  Performing Technologist: Jerel Croak RVT  Examination Guidelines: A complete evaluation includes at minimum, Doppler waveform signals and systolic blood pressure reading at the level of bilateral brachial, anterior tibial, and posterior tibial arteries, when vessel segments are accessible. Bilateral testing is considered an integral part of a complete examination. Photoelectric Plethysmograph (PPG) waveforms and toe systolic pressure readings are included as required and additional duplex testing as needed. Limited examinations for reoccurring indications may be performed as noted.  ABI Findings: +---------+------------------+-----+--------+--------+ Right    Rt Pressure (mmHg)IndexWaveformComment  +---------+------------------+-----+--------+--------+ Brachial 136                                     +---------+------------------+-----+--------+--------+ PTA      92                0.66 biphasic         +---------+------------------+-----+--------+--------+ DP       99                0.71 biphasic         +---------+------------------+-----+--------+--------+ Great Toe59                0.42 Normal           +---------+------------------+-----+--------+--------+ +---------+------------------+-----+---------+-------+ Left     Lt Pressure (mmHg)IndexWaveform Comment +---------+------------------+-----+---------+-------+ Brachial 140                                     +---------+------------------+-----+---------+-------+ PTA      116               0.83 biphasic          +---------+------------------+-----+---------+-------+ DP       103               0.74 triphasic        +---------+------------------+-----+---------+-------+ Tony Solomon  0.56 Normal           +---------+------------------+-----+---------+-------+  Summary: Right: Resting right ankle-brachial index indicates moderate right lower extremity arterial disease. The right toe-brachial index is abnormal. Left: Resting left ankle-brachial index indicates mild left lower extremity arterial disease. The left toe-brachial index is normal. *See table(s) above for measurements and observations.  Electronically signed by Cordella Shawl MD on 10/27/2023 at 4:04:37 PM.    Final        Assessment & Plan:   1. 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 with rest pain (HCC) (Primary)  Recommend:  The patient has evidence of severe atherosclerotic changes of both lower extremities associated with ulceration and tissue loss of the bilateral feet.  This represents a limb threatening ischemia and places the patient at a high risk for limb loss.  Angiography has been performed and the situation is not ideal for intervention.  Given this finding open surgical repair is recommended.   Patient should undergo arterial reconstruction, bilateral femoral endarterectomies with Endologix stent graft.  It was also discussed that if we are unable to cross the aortic lesion that he may require a aortobifem bypass.  This is done with the hope for limb salvage.  The risks and benefits as well as the alternative therapies was discussed in detail with the patient.  All questions were answered.  Patient agrees to proceed with open vascular surgical reconstruction.  The patient will follow up with me in the office after the procedure.    2. Essential hypertension Continue antihypertensive medications as already ordered, these medications have been reviewed and there are no changes at this  time.   Current Outpatient Medications on File Prior to Visit  Medication Sig Dispense Refill   aspirin  EC 81 MG tablet Take 81 mg by mouth daily. Swallow whole.     ergocalciferol (VITAMIN D2) 1.25 MG (50000 UT) capsule Take 50,000 Units by mouth once a week.     levothyroxine (SYNTHROID) 50 MCG tablet Take 50 mcg by mouth daily before breakfast.     losartan (COZAAR) 50 MG tablet Take 50 mg by mouth daily. Patient unsure of dosage at this time     tadalafil (CIALIS) 5 MG tablet Take 5 mg by mouth daily as needed.     No current facility-administered medications on file prior to visit.    There are no Patient Instructions on file for this visit. No follow-ups on file.   Ajane Novella E Saranne Crislip, NP

## 2023-11-24 ENCOUNTER — Other Ambulatory Visit (INDEPENDENT_AMBULATORY_CARE_PROVIDER_SITE_OTHER): Payer: Self-pay | Admitting: Nurse Practitioner

## 2023-11-24 DIAGNOSIS — Z79899 Other long term (current) drug therapy: Secondary | ICD-10-CM | POA: Diagnosis not present

## 2023-11-24 DIAGNOSIS — Z1331 Encounter for screening for depression: Secondary | ICD-10-CM | POA: Diagnosis not present

## 2023-11-24 DIAGNOSIS — E079 Disorder of thyroid, unspecified: Secondary | ICD-10-CM | POA: Diagnosis not present

## 2023-11-24 DIAGNOSIS — E559 Vitamin D deficiency, unspecified: Secondary | ICD-10-CM | POA: Diagnosis not present

## 2023-11-24 DIAGNOSIS — I10 Essential (primary) hypertension: Secondary | ICD-10-CM | POA: Diagnosis not present

## 2023-11-24 DIAGNOSIS — E782 Mixed hyperlipidemia: Secondary | ICD-10-CM | POA: Diagnosis not present

## 2023-11-24 DIAGNOSIS — Z125 Encounter for screening for malignant neoplasm of prostate: Secondary | ICD-10-CM | POA: Diagnosis not present

## 2023-11-24 DIAGNOSIS — I70221 Atherosclerosis of native arteries of extremities with rest pain, right leg: Secondary | ICD-10-CM

## 2023-11-24 DIAGNOSIS — Z1211 Encounter for screening for malignant neoplasm of colon: Secondary | ICD-10-CM | POA: Diagnosis not present

## 2023-11-24 DIAGNOSIS — E118 Type 2 diabetes mellitus with unspecified complications: Secondary | ICD-10-CM | POA: Diagnosis not present

## 2023-11-24 DIAGNOSIS — Z0000 Encounter for general adult medical examination without abnormal findings: Secondary | ICD-10-CM | POA: Diagnosis not present

## 2023-11-28 ENCOUNTER — Other Ambulatory Visit (INDEPENDENT_AMBULATORY_CARE_PROVIDER_SITE_OTHER): Payer: Self-pay | Admitting: Vascular Surgery

## 2023-11-28 DIAGNOSIS — I70221 Atherosclerosis of native arteries of extremities with rest pain, right leg: Secondary | ICD-10-CM

## 2023-11-28 NOTE — Progress Notes (Signed)
 Patient will require stent grafting of the aortoiliac system with bilateral femoral endarterectomies.  Consequently he will require cardiac clearance.  Ambulatory referral has been sent.

## 2023-11-30 DIAGNOSIS — E782 Mixed hyperlipidemia: Secondary | ICD-10-CM | POA: Diagnosis not present

## 2023-11-30 DIAGNOSIS — Z79899 Other long term (current) drug therapy: Secondary | ICD-10-CM | POA: Diagnosis not present

## 2023-11-30 DIAGNOSIS — Z125 Encounter for screening for malignant neoplasm of prostate: Secondary | ICD-10-CM | POA: Diagnosis not present

## 2023-11-30 DIAGNOSIS — Z1211 Encounter for screening for malignant neoplasm of colon: Secondary | ICD-10-CM | POA: Diagnosis not present

## 2023-11-30 DIAGNOSIS — I10 Essential (primary) hypertension: Secondary | ICD-10-CM | POA: Diagnosis not present

## 2023-11-30 DIAGNOSIS — E079 Disorder of thyroid, unspecified: Secondary | ICD-10-CM | POA: Diagnosis not present

## 2023-11-30 DIAGNOSIS — E118 Type 2 diabetes mellitus with unspecified complications: Secondary | ICD-10-CM | POA: Diagnosis not present

## 2023-12-21 DIAGNOSIS — Z01818 Encounter for other preprocedural examination: Secondary | ICD-10-CM | POA: Diagnosis not present

## 2023-12-21 DIAGNOSIS — I2584 Coronary atherosclerosis due to calcified coronary lesion: Secondary | ICD-10-CM | POA: Diagnosis not present

## 2023-12-21 DIAGNOSIS — I739 Peripheral vascular disease, unspecified: Secondary | ICD-10-CM | POA: Diagnosis not present

## 2023-12-21 DIAGNOSIS — I2510 Atherosclerotic heart disease of native coronary artery without angina pectoris: Secondary | ICD-10-CM | POA: Diagnosis not present

## 2023-12-21 DIAGNOSIS — E782 Mixed hyperlipidemia: Secondary | ICD-10-CM | POA: Diagnosis not present

## 2023-12-21 DIAGNOSIS — I10 Essential (primary) hypertension: Secondary | ICD-10-CM | POA: Diagnosis not present

## 2023-12-28 DIAGNOSIS — I2510 Atherosclerotic heart disease of native coronary artery without angina pectoris: Secondary | ICD-10-CM | POA: Diagnosis not present

## 2023-12-28 DIAGNOSIS — I2584 Coronary atherosclerosis due to calcified coronary lesion: Secondary | ICD-10-CM | POA: Diagnosis not present

## 2024-01-10 DIAGNOSIS — I2584 Coronary atherosclerosis due to calcified coronary lesion: Secondary | ICD-10-CM | POA: Diagnosis not present

## 2024-01-10 DIAGNOSIS — I5022 Chronic systolic (congestive) heart failure: Secondary | ICD-10-CM | POA: Diagnosis not present

## 2024-01-10 DIAGNOSIS — Z01818 Encounter for other preprocedural examination: Secondary | ICD-10-CM | POA: Diagnosis not present

## 2024-01-10 DIAGNOSIS — I10 Essential (primary) hypertension: Secondary | ICD-10-CM | POA: Diagnosis not present

## 2024-01-10 DIAGNOSIS — E782 Mixed hyperlipidemia: Secondary | ICD-10-CM | POA: Diagnosis not present

## 2024-01-10 DIAGNOSIS — I2510 Atherosclerotic heart disease of native coronary artery without angina pectoris: Secondary | ICD-10-CM | POA: Diagnosis not present

## 2024-01-10 DIAGNOSIS — I739 Peripheral vascular disease, unspecified: Secondary | ICD-10-CM | POA: Diagnosis not present

## 2024-01-27 ENCOUNTER — Telehealth (INDEPENDENT_AMBULATORY_CARE_PROVIDER_SITE_OTHER): Payer: Self-pay

## 2024-01-27 NOTE — Telephone Encounter (Signed)
 I attempted to contact the patient to schedule him for a bilateral femoral endarterectomy and Endologix stent graft with Dr. Jama. A message was left for a return call.

## 2024-01-31 NOTE — Telephone Encounter (Signed)
 Patient called back and is schedule with Dr. Jama on 02/08/24 for a bilateral femoral endarterectomy and Endologix stent graft at the [facility]. Pre-admit will call to schedule pre-op at the [facility]. Pre-surgical instructions were discussed and will be mailed.

## 2024-02-02 ENCOUNTER — Telehealth (INDEPENDENT_AMBULATORY_CARE_PROVIDER_SITE_OTHER): Payer: Self-pay

## 2024-02-02 NOTE — Telephone Encounter (Signed)
 Spoke with the patient and he has been rescheduled to 02/15/24 for his bilateral femoral endarterectomy with Endologix stent graft with Dr. Jama.

## 2024-02-06 ENCOUNTER — Other Ambulatory Visit (INDEPENDENT_AMBULATORY_CARE_PROVIDER_SITE_OTHER): Payer: Self-pay | Admitting: Nurse Practitioner

## 2024-02-06 ENCOUNTER — Encounter
Admission: RE | Admit: 2024-02-06 | Discharge: 2024-02-06 | Disposition: A | Discharge status: Home / Self Care | Source: Ambulatory Visit | Attending: Vascular Surgery | Admitting: Vascular Surgery

## 2024-02-06 ENCOUNTER — Other Ambulatory Visit: Payer: Self-pay

## 2024-02-06 VITALS — BP 121/74 | HR 66 | Temp 97.4°F | Resp 18 | Ht 62.0 in | Wt 133.3 lb

## 2024-02-06 DIAGNOSIS — I70221 Atherosclerosis of native arteries of extremities with rest pain, right leg: Secondary | ICD-10-CM

## 2024-02-06 DIAGNOSIS — Z01818 Encounter for other preprocedural examination: Secondary | ICD-10-CM | POA: Diagnosis present

## 2024-02-06 DIAGNOSIS — Z01812 Encounter for preprocedural laboratory examination: Secondary | ICD-10-CM | POA: Diagnosis not present

## 2024-02-06 HISTORY — DX: Gastro-esophageal reflux disease without esophagitis: K21.9

## 2024-02-06 HISTORY — DX: Vitamin D deficiency, unspecified: E55.9

## 2024-02-06 HISTORY — DX: Mixed hyperlipidemia: E78.2

## 2024-02-06 HISTORY — DX: Essential (primary) hypertension: I10

## 2024-02-06 HISTORY — DX: Unspecified atherosclerosis of native arteries of extremities, right leg: I70.201

## 2024-02-06 HISTORY — DX: Hypothyroidism, unspecified: E03.9

## 2024-02-06 HISTORY — DX: Malignant (primary) neoplasm, unspecified: C80.1

## 2024-02-06 LAB — CBC WITH DIFFERENTIAL/PLATELET
Abs Immature Granulocytes: 0.09 K/uL — ABNORMAL HIGH (ref 0.00–0.07)
Basophils Absolute: 0.1 K/uL (ref 0.0–0.1)
Basophils Relative: 1 %
Eosinophils Absolute: 0.2 K/uL (ref 0.0–0.5)
Eosinophils Relative: 2 %
HCT: 39.7 % (ref 39.0–52.0)
Hemoglobin: 14 g/dL (ref 13.0–17.0)
Immature Granulocytes: 1 %
Lymphocytes Relative: 30 %
Lymphs Abs: 2.9 K/uL (ref 0.7–4.0)
MCH: 32.3 pg (ref 26.0–34.0)
MCHC: 35.3 g/dL (ref 30.0–36.0)
MCV: 91.7 fL (ref 80.0–100.0)
Monocytes Absolute: 0.8 K/uL (ref 0.1–1.0)
Monocytes Relative: 8 %
Neutro Abs: 5.7 K/uL (ref 1.7–7.7)
Neutrophils Relative %: 58 %
Platelets: 267 K/uL (ref 150–400)
RBC: 4.33 MIL/uL (ref 4.22–5.81)
RDW: 12.1 % (ref 11.5–15.5)
WBC: 9.7 K/uL (ref 4.0–10.5)
nRBC: 0 % (ref 0.0–0.2)

## 2024-02-06 LAB — SURGICAL PCR SCREEN
MRSA, PCR: NEGATIVE
Staphylococcus aureus: NEGATIVE

## 2024-02-06 LAB — BASIC METABOLIC PANEL WITH GFR
Anion gap: 13 (ref 5–15)
BUN: 13 mg/dL (ref 8–23)
CO2: 27 mmol/L (ref 22–32)
Calcium: 9.3 mg/dL (ref 8.9–10.3)
Chloride: 96 mmol/L — ABNORMAL LOW (ref 98–111)
Creatinine, Ser: 0.82 mg/dL (ref 0.61–1.24)
GFR, Estimated: >60 mL/min (ref >60)
Glucose, Bld: 100 mg/dL — ABNORMAL HIGH (ref 70–99)
Potassium: 3.6 mmol/L (ref 3.5–5.1)
Sodium: 136 mmol/L (ref 135–145)

## 2024-02-06 LAB — TYPE AND SCREEN
ABO/RH(D): A POS
Antibody Screen: NEGATIVE

## 2024-02-06 NOTE — Patient Instructions (Signed)
 Your procedure is scheduled on: Wednesday 02/15/24 Report to the Registration Desk on the 1st floor of the [facility]. To find out your arrival time, please call [phone] between 1PM - 3PM on: Tuesday 02/14/24  If your arrival time is 6:00 am, do not arrive before that time as the [facility] entrance doors do not open until 6:00 am.  REMEMBER: Instructions that are not followed completely may result in serious medical risk, up to and including death; or upon the discretion of your surgeon and anesthesiologist your surgery may need to be rescheduled.  Do not eat food after midnight the night before surgery.  No gum chewing or hard candies.   One week prior to surgery: Stop Anti-inflammatories (NSAIDS) such as Advil, Aleve, Ibuprofen, Motrin, Naproxen, Naprosyn and Aspirin  based products such as Excedrin, Goody's Powder, BC Powder. Stop ANY OVER THE COUNTER supplements until after surgery.  You may however, continue to take Tylenol if needed for pain up until the day of surgery.  Stop tadalafil (CIALIS) 5 MG 2 days prior to surgery (DO NOT take after Sunday 02/12/24)  Continue taking all of your other prescription medications up until the day of surgery.  ON THE DAY OF SURGERY ONLY TAKE THESE MEDICATIONS WITH SIPS OF WATER:  levothyroxine (SYNTHROID) 50 MCG  rosuvastatin (CRESTOR) 20 MG    No Alcohol for 24 hours before or after surgery.  No Smoking including e-cigarettes for 24 hours before surgery.  No chewable tobacco products for at least 6 hours before surgery.  No nicotine patches on the day of surgery.  Do not use any recreational drugs for at least a week (preferably 2 weeks) before your surgery.  Please be advised that the combination of cocaine and anesthesia may have negative outcomes, up to and including death. If you test positive for cocaine, your surgery will be cancelled.  On the morning of surgery brush your teeth with toothpaste and water, you may rinse  your mouth with mouthwash if you wish. Do not swallow any toothpaste or mouthwash.  Use CHG Soap or wipes as directed on instruction sheet.  Do not wear jewelry, make-up, hairpins, clips or nail polish.  For welded (permanent) jewelry: bracelets, anklets, waist bands, etc.  Please have this removed prior to surgery.  If it is not removed, there is a chance that hospital personnel will need to cut it off on the day of surgery.  Do not wear lotions, powders, or perfumes.   Do not shave body hair from the neck down 48 hours before surgery.  Contact lenses, hearing aids and dentures may not be worn into surgery.  Do not bring valuables to the hospital. [facility] is not responsible for any missing/lost belongings or valuables.   Bring your C-PAP to the hospital in case you may have to spend the night.   Notify your doctor if there is any change in your medical condition (cold, fever, infection).  Wear comfortable clothing (specific to your surgery type) to the hospital.  After surgery, you can help prevent lung complications by doing breathing exercises.  Take deep breaths and cough every 1-2 hours. Your doctor may order a device called an Incentive Spirometer to help you take deep breaths. When coughing or sneezing, hold a pillow firmly against your incision with both hands. This is called "splinting." Doing this helps protect your incision. It also decreases belly discomfort.  If you are being admitted to the hospital overnight, leave your suitcase in the car. After  surgery it may be brought to your room.  In case of increased patient census, it may be necessary for you, the patient, to continue your postoperative care in the Same Day Surgery department.  If you are being discharged the day of surgery, you will not be allowed to drive home. You will need a responsible individual to drive you home and stay with you for 24 hours after surgery.   If you are taking public transportation,  you will need to have a responsible individual with you.  Please call the Pre-admissions Testing Dept. at [phone] if you have any questions about these instructions.  Surgery Visitation Policy:  Patients having surgery or a procedure may have two visitors.  Children under the age of 64 must have an adult with them who is not the patient.  Inpatient Visitation:    Visiting hours are 7 a.m. to 8 p.m. Up to four visitors are allowed at one time in a patient room. The visitors may rotate out with other people during the day.  One visitor age 73 or older may stay with the patient overnight and must be in the room by 8 p.m.   Merchandiser, retail to address health-related social needs:  [email or web address]                                                                                                             Preparing for Surgery with CHLORHEXIDINE GLUCONATE (CHG) Soap  Chlorhexidine Gluconate (CHG) Soap  o An antiseptic cleaner that kills germs and bonds with the skin to continue killing germs even after washing  o Used for showering the night before surgery and morning of surgery  Before surgery, you can play an important role by reducing the number of germs on your skin.  CHG (Chlorhexidine gluconate) soap is an antiseptic cleanser which kills germs and bonds with the skin to continue killing germs even after washing.  Please do not use if you have an allergy to CHG or antibacterial soaps. If your skin becomes reddened/irritated stop using the CHG.  1. Shower the NIGHT BEFORE SURGERY and the MORNING OF SURGERY with CHG soap.  2. If you choose to wash your hair, wash your hair first as usual with your normal shampoo.  3. After shampooing, rinse your hair and body thoroughly to remove the shampoo.  4. Use CHG as you would any other liquid soap. You can apply CHG directly to the skin and wash gently with a scrungie or a clean washcloth.  5. Apply  the CHG soap to your body only from the neck down. Do not use on open wounds or open sores. Avoid contact with your eyes, ears, mouth, and genitals (private parts). Wash face and genitals (private parts) with your normal soap.  6. Wash thoroughly, paying special attention to the area where your surgery will be performed.  7. Thoroughly rinse your body with warm water.  8. Do not shower/wash with your normal soap after using and rinsing off the CHG soap.  9. Bruna  yourself dry with a clean towel.  10. Wear clean pajamas to bed the night before surgery.  12. Place clean sheets on your bed the night of your first shower and do not sleep with pets.  13. Shower again with the CHG soap on the day of surgery prior to arriving at the hospital.  14. Do not apply any deodorants/lotions/powders.  15. Please wear clean clothes to the hospital.  How to Use an Incentive Spirometer  An incentive spirometer is a tool that measures how well you are filling your lungs with each breath. Learning to take long, deep breaths using this tool can help you keep your lungs clear and active. This may help to reverse or lessen your chance of developing breathing (pulmonary) problems, especially infection. You may be asked to use a spirometer: After a surgery. If you have a lung problem or a history of smoking. After a long period of time when you have been unable to move or be active. If the spirometer includes an indicator to show the highest number that you have reached, your health care provider or respiratory therapist will help you set a goal. Keep a log of your progress as told by your health care provider. What are the risks? Breathing too quickly may cause dizziness or cause you to pass out. Take your time so you do not get dizzy or light-headed. If you are in pain, you may need to take pain medicine before doing incentive spirometry. It is harder to take a deep breath if you are having pain. How to use your  incentive spirometer  Sit up on the edge of your bed or on a chair. Hold the incentive spirometer so that it is in an upright position. Before you use the spirometer, breathe out normally. Place the mouthpiece in your mouth. Make sure your lips are closed tightly around it. Breathe in slowly and as deeply as you can through your mouth, causing the piston or the ball to rise toward the top of the chamber. Hold your breath for 3-5 seconds, or for as long as possible. If the spirometer includes a coach indicator, use this to guide you in breathing. Slow down your breathing if the indicator goes above the marked areas. Remove the mouthpiece from your mouth and breathe out normally. The piston or ball will return to the bottom of the chamber. Rest for a few seconds, then repeat the steps 10 or more times. Take your time and take a few normal breaths between deep breaths so that you do not get dizzy or light-headed. Do this every 1-2 hours when you are awake. If the spirometer includes a goal marker to show the highest number you have reached (best effort), use this as a goal to work toward during each repetition. After each set of 10 deep breaths, cough a few times. This will help to make sure that your lungs are clear. If you have an incision on your chest or abdomen from surgery, place a pillow or a rolled-up towel firmly against the incision when you cough. This can help to reduce pain while taking deep breaths and coughing. General tips When you are able to get out of bed: Walk around often. Continue to take deep breaths and cough in order to clear your lungs. Keep using the incentive spirometer until your health care provider says it is okay to stop using it. If you have been in the hospital, you may be told to keep using the spirometer  at home. Contact a health care provider if: You are having difficulty using the spirometer. You have trouble using the spirometer as often as instructed. Your  pain medicine is not giving enough relief for you to use the spirometer as told. You have a fever. Get help right away if: You develop shortness of breath. You develop a cough with bloody mucus from the lungs. You have fluid or blood coming from an incision site after you cough. Summary An incentive spirometer is a tool that can help you learn to take long, deep breaths to keep your lungs clear and active. You may be asked to use a spirometer after a surgery, if you have a lung problem or a history of smoking, or if you have been inactive for a long period of time. Use your incentive spirometer as instructed every 1-2 hours while you are awake. If you have an incision on your chest or abdomen, place a pillow or a rolled-up towel firmly against your incision when you cough. This will help to reduce pain. Get help right away if you have shortness of breath, you cough up bloody mucus, or blood comes from your incision when you cough. This information is not intended to replace advice given to you by your health care provider. Make sure you discuss any questions you have with your health care provider. Document Revised: 08/06/2019 Document Reviewed: 08/06/2019 Elsevier Patient Education  2023 [employer].

## 2024-02-15 ENCOUNTER — Inpatient Hospital Stay
Admission: RE | Admit: 2024-02-15 | Discharge: 2024-02-17 | DRG: 269 | Disposition: A | Discharge status: Home / Self Care | Attending: Vascular Surgery | Admitting: Vascular Surgery

## 2024-02-15 ENCOUNTER — Encounter
Admission: RE | Disposition: A | Discharge status: Home / Self Care | Payer: Self-pay | Source: Home / Self Care | Attending: Vascular Surgery

## 2024-02-15 ENCOUNTER — Inpatient Hospital Stay

## 2024-02-15 ENCOUNTER — Encounter: Payer: Self-pay | Admitting: Vascular Surgery

## 2024-02-15 ENCOUNTER — Other Ambulatory Visit: Payer: Self-pay

## 2024-02-15 DIAGNOSIS — Z7982 Long term (current) use of aspirin: Secondary | ICD-10-CM

## 2024-02-15 DIAGNOSIS — E039 Hypothyroidism, unspecified: Secondary | ICD-10-CM | POA: Diagnosis present

## 2024-02-15 DIAGNOSIS — I70202 Unspecified atherosclerosis of native arteries of extremities, left leg: Secondary | ICD-10-CM | POA: Diagnosis not present

## 2024-02-15 DIAGNOSIS — I10 Essential (primary) hypertension: Secondary | ICD-10-CM | POA: Diagnosis present

## 2024-02-15 DIAGNOSIS — I70223 Atherosclerosis of native arteries of extremities with rest pain, bilateral legs: Secondary | ICD-10-CM | POA: Diagnosis not present

## 2024-02-15 DIAGNOSIS — E782 Mixed hyperlipidemia: Secondary | ICD-10-CM | POA: Diagnosis present

## 2024-02-15 DIAGNOSIS — Z87891 Personal history of nicotine dependence: Secondary | ICD-10-CM

## 2024-02-15 DIAGNOSIS — I70229 Atherosclerosis of native arteries of extremities with rest pain, unspecified extremity: Principal | ICD-10-CM | POA: Diagnosis present

## 2024-02-15 DIAGNOSIS — Z8249 Family history of ischemic heart disease and other diseases of the circulatory system: Secondary | ICD-10-CM

## 2024-02-15 DIAGNOSIS — Z85828 Personal history of other malignant neoplasm of skin: Secondary | ICD-10-CM | POA: Diagnosis not present

## 2024-02-15 DIAGNOSIS — I7090 Unspecified atherosclerosis: Secondary | ICD-10-CM

## 2024-02-15 DIAGNOSIS — Z9889 Other specified postprocedural states: Secondary | ICD-10-CM | POA: Diagnosis not present

## 2024-02-15 DIAGNOSIS — I70201 Unspecified atherosclerosis of native arteries of extremities, right leg: Secondary | ICD-10-CM | POA: Diagnosis not present

## 2024-02-15 DIAGNOSIS — I700 Atherosclerosis of aorta: Secondary | ICD-10-CM

## 2024-02-15 DIAGNOSIS — R52 Pain, unspecified: Secondary | ICD-10-CM | POA: Diagnosis not present

## 2024-02-15 DIAGNOSIS — Z7902 Long term (current) use of antithrombotics/antiplatelets: Secondary | ICD-10-CM | POA: Diagnosis not present

## 2024-02-15 HISTORY — PX: ABDOMINAL AORTIC ENDOVASCULAR STENT GRAFT: SHX5707

## 2024-02-15 HISTORY — PX: APPLICATION OF CELL SAVER: SHX7529

## 2024-02-15 HISTORY — PX: ENDARTERECTOMY FEMORAL: SHX5804

## 2024-02-15 LAB — CBC
HCT: 30.8 % — ABNORMAL LOW (ref 39.0–52.0)
Hemoglobin: 10.6 g/dL — ABNORMAL LOW (ref 13.0–17.0)
MCH: 32.4 pg (ref 26.0–34.0)
MCHC: 34.4 g/dL (ref 30.0–36.0)
MCV: 94.2 fL (ref 80.0–100.0)
Platelets: 187 K/uL (ref 150–400)
RBC: 3.27 MIL/uL — ABNORMAL LOW (ref 4.22–5.81)
RDW: 12.4 % (ref 11.5–15.5)
WBC: 19.2 K/uL — ABNORMAL HIGH (ref 4.0–10.5)
nRBC: 0 % (ref 0.0–0.2)

## 2024-02-15 LAB — CREATININE, SERUM
Creatinine, Ser: 0.88 mg/dL (ref 0.61–1.24)
GFR, Estimated: >60 mL/min

## 2024-02-15 LAB — GLUCOSE, CAPILLARY
Glucose-Capillary: 256 mg/dL — ABNORMAL HIGH (ref 70–99)
Glucose-Capillary: 221 mg/dL — ABNORMAL HIGH (ref 70–99)
Glucose-Capillary: 212 mg/dL — ABNORMAL HIGH (ref 70–99)

## 2024-02-15 LAB — HEMOGLOBIN A1C
Hgb A1c MFr Bld: 6.2 % — ABNORMAL HIGH (ref 4.8–5.6)
Mean Plasma Glucose: 131.24 mg/dL

## 2024-02-15 LAB — ABO/RH: ABO/RH(D): A POS

## 2024-02-15 SURGERY — ENDARTERECTOMY, FEMORAL
Anesthesia: General

## 2024-02-15 MED ORDER — CHLORHEXIDINE GLUCONATE CLOTH 2 % EX PADS
6.0000 | MEDICATED_PAD | Freq: Once | CUTANEOUS | Status: AC
Start: 2024-02-15 — End: 2024-02-15
  Administered 2024-02-15: 6 via TOPICAL

## 2024-02-15 MED ORDER — INSULIN ASPART 100 UNIT/ML IJ SOLN
0.0000 [IU] | Freq: Three times a day (TID) | INTRAMUSCULAR | Status: DC
  Administered 2024-02-15: 5 [IU] via SUBCUTANEOUS
  Administered 2024-02-16: 2 [IU] via SUBCUTANEOUS
  Administered 2024-02-16 (×2): 3 [IU] via SUBCUTANEOUS
  Administered 2024-02-17: 2 [IU] via SUBCUTANEOUS
  Filled 2024-02-15 (×5): qty 1

## 2024-02-15 MED ORDER — IRBESARTAN 150 MG PO TABS
300.0000 mg | ORAL_TABLET | Freq: Every day | ORAL | Status: DC
Start: 2024-02-15 — End: 2024-02-17
  Administered 2024-02-16 – 2024-02-17 (×2): 300 mg via ORAL
  Filled 2024-02-15 (×2): qty 2

## 2024-02-15 MED ORDER — CHLORHEXIDINE GLUCONATE 0.12 % MT SOLN
15.0000 mL | Freq: Once | OROMUCOSAL | Status: AC
  Administered 2024-02-15: 15 mL via OROMUCOSAL

## 2024-02-15 MED ORDER — HEPARIN 30,000 UNITS/1000 ML (OHS) CELLSAVER SOLUTION
Status: AC
  Filled 2024-02-15: qty 1000

## 2024-02-15 MED ORDER — SENNOSIDES-DOCUSATE SODIUM 8.6-50 MG PO TABS: 1.0000 | ORAL_TABLET | Freq: Every evening | ORAL | Status: DC | PRN

## 2024-02-15 MED ORDER — VANCOMYCIN HCL 1 G IV SOLR
INTRAVENOUS | Status: DC | PRN
  Administered 2024-02-15: 1000 mg

## 2024-02-15 MED ORDER — SODIUM CHLORIDE 0.9 % IV SOLN: INTRAVENOUS | Status: AC

## 2024-02-15 MED ORDER — DEXAMETHASONE SODIUM PHOSPHATE 10 MG/ML IJ SOLN
INTRAMUSCULAR | Status: AC
  Filled 2024-02-15: qty 1

## 2024-02-15 MED ORDER — ONDANSETRON HCL 4 MG/2ML IJ SOLN
INTRAMUSCULAR | Status: AC
  Filled 2024-02-15: qty 2

## 2024-02-15 MED ORDER — ASPIRIN 81 MG PO TBEC
81.0000 mg | DELAYED_RELEASE_TABLET | Freq: Every day | ORAL | Status: DC
  Administered 2024-02-16 – 2024-02-17 (×2): 81 mg via ORAL
  Filled 2024-02-15 (×2): qty 1

## 2024-02-15 MED ORDER — SEVOFLURANE IN SOLN
RESPIRATORY_TRACT | Status: AC
  Filled 2024-02-15: qty 250

## 2024-02-15 MED ORDER — CEFAZOLIN SODIUM-DEXTROSE 2-4 GM/100ML-% IV SOLN
INTRAVENOUS | Status: AC
  Filled 2024-02-15: qty 100

## 2024-02-15 MED ORDER — LIDOCAINE HCL (PF) 2 % IJ SOLN
INTRAMUSCULAR | Status: AC
  Filled 2024-02-15: qty 5

## 2024-02-15 MED ORDER — GLYCOPYRROLATE 0.2 MG/ML IJ SOLN
INTRAMUSCULAR | Status: AC
  Filled 2024-02-15: qty 1

## 2024-02-15 MED ORDER — GLYCOPYRROLATE 0.2 MG/ML IJ SOLN
INTRAMUSCULAR | Status: DC | PRN
  Administered 2024-02-15: .2 mg via INTRAVENOUS

## 2024-02-15 MED ORDER — LACTATED RINGERS IV SOLN: INTRAVENOUS | Status: DC | PRN

## 2024-02-15 MED ORDER — HYDROMORPHONE HCL 1 MG/ML IJ SOLN
INTRAMUSCULAR | Status: AC
  Filled 2024-02-15: qty 1

## 2024-02-15 MED ORDER — CLOPIDOGREL BISULFATE 75 MG PO TABS
75.0000 mg | ORAL_TABLET | Freq: Every day | ORAL | Status: DC
  Administered 2024-02-16 – 2024-02-17 (×2): 75 mg via ORAL
  Filled 2024-02-15 (×2): qty 1

## 2024-02-15 MED ORDER — OXYCODONE HCL 5 MG PO TABS: 5.0000 mg | ORAL_TABLET | ORAL | Status: DC | PRN

## 2024-02-15 MED ORDER — PROPOFOL 10 MG/ML IV BOLUS
INTRAVENOUS | Status: AC
  Filled 2024-02-15: qty 20

## 2024-02-15 MED ORDER — GENTAMICIN SULFATE 40 MG/ML IJ SOLN
INTRAMUSCULAR | Status: AC
  Filled 2024-02-15: qty 4

## 2024-02-15 MED ORDER — PHENYLEPHRINE HCL-NACL 20-0.9 MG/250ML-% IV SOLN
INTRAVENOUS | Status: DC | PRN
  Administered 2024-02-15: 5 ug/min via INTRAVENOUS

## 2024-02-15 MED ORDER — PHENYLEPHRINE 80 MCG/ML (10ML) SYRINGE FOR IV PUSH (FOR BLOOD PRESSURE SUPPORT)
PREFILLED_SYRINGE | INTRAVENOUS | Status: AC
  Filled 2024-02-15: qty 10

## 2024-02-15 MED ORDER — HYDROMORPHONE HCL 1 MG/ML IJ SOLN
INTRAMUSCULAR | Status: DC | PRN
  Administered 2024-02-15 (×2): .2 mg via INTRAVENOUS
  Administered 2024-02-15 (×2): .3 mg via INTRAVENOUS

## 2024-02-15 MED ORDER — LEVOTHYROXINE SODIUM 50 MCG PO TABS
50.0000 ug | ORAL_TABLET | Freq: Every day | ORAL | Status: DC
  Administered 2024-02-16 – 2024-02-17 (×2): 50 ug via ORAL
  Filled 2024-02-15 (×2): qty 1

## 2024-02-15 MED ORDER — HEPARIN SODIUM (PORCINE) 1000 UNIT/ML IJ SOLN
INTRAMUSCULAR | Status: DC | PRN
Start: 2024-02-15 — End: 2024-02-15
  Administered 2024-02-15: 7000 [IU] via INTRAVENOUS
  Administered 2024-02-15: 1000 [IU] via INTRAVENOUS
  Administered 2024-02-15: 2000 [IU] via INTRAVENOUS

## 2024-02-15 MED ORDER — ONDANSETRON HCL 4 MG/2ML IJ SOLN: 4.0000 mg | Freq: Four times a day (QID) | INTRAMUSCULAR | Status: DC | PRN

## 2024-02-15 MED ORDER — NITROGLYCERIN IN D5W 200-5 MCG/ML-% IV SOLN: 5.0000 ug/min | INTRAVENOUS | Status: DC

## 2024-02-15 MED ORDER — SUGAMMADEX SODIUM 200 MG/2ML IV SOLN
INTRAVENOUS | Status: DC | PRN
  Administered 2024-02-15: 150 mg via INTRAVENOUS
  Administered 2024-02-15: 50 mg via INTRAVENOUS

## 2024-02-15 MED ORDER — ACETAMINOPHEN 325 MG PO TABS: 325.0000 mg | ORAL_TABLET | ORAL | Status: DC | PRN

## 2024-02-15 MED ORDER — ROCURONIUM BROMIDE 10 MG/ML (PF) SYRINGE
PREFILLED_SYRINGE | INTRAVENOUS | Status: AC
  Filled 2024-02-15: qty 10

## 2024-02-15 MED ORDER — PROPOFOL 10 MG/ML IV BOLUS
INTRAVENOUS | Status: DC | PRN
  Administered 2024-02-15: 150 mg via INTRAVENOUS
  Administered 2024-02-15: 100 mg via INTRAVENOUS

## 2024-02-15 MED ORDER — HEMOSTATIC AGENTS (NO CHARGE) OPTIME
TOPICAL | Status: DC | PRN
  Administered 2024-02-15 (×2): 2 via TOPICAL

## 2024-02-15 MED ORDER — CHLORHEXIDINE GLUCONATE CLOTH 2 % EX PADS
6.0000 | MEDICATED_PAD | Freq: Every evening | CUTANEOUS | Status: DC
  Administered 2024-02-15: 6 via TOPICAL

## 2024-02-15 MED ORDER — DEXAMETHASONE SODIUM PHOSPHATE 10 MG/ML IJ SOLN
INTRAMUSCULAR | Status: DC | PRN
  Administered 2024-02-15: 10 mg via INTRAVENOUS

## 2024-02-15 MED ORDER — CEFAZOLIN SODIUM-DEXTROSE 2-4 GM/100ML-% IV SOLN
2.0000 g | INTRAVENOUS | Status: AC
  Administered 2024-02-15: 2 g via INTRAVENOUS

## 2024-02-15 MED ORDER — INSULIN ASPART 100 UNIT/ML IJ SOLN
0.0000 [IU] | Freq: Every day | INTRAMUSCULAR | Status: DC
  Administered 2024-02-15: 2 [IU] via SUBCUTANEOUS
  Filled 2024-02-15: qty 1

## 2024-02-15 MED ORDER — ENOXAPARIN SODIUM 40 MG/0.4ML IJ SOSY
40.0000 mg | PREFILLED_SYRINGE | INTRAMUSCULAR | Status: DC
  Administered 2024-02-16 – 2024-02-17 (×2): 40 mg via SUBCUTANEOUS
  Filled 2024-02-15 (×2): qty 0.4

## 2024-02-15 MED ORDER — METOPROLOL TARTRATE 5 MG/5ML IV SOLN: 2.5000 mg | INTRAVENOUS | Status: DC | PRN

## 2024-02-15 MED ORDER — HYDROMORPHONE HCL 1 MG/ML IJ SOLN
1.0000 mg | Freq: Once | INTRAMUSCULAR | Status: AC | PRN
  Administered 2024-02-16: 1 mg via INTRAVENOUS
  Filled 2024-02-15: qty 1

## 2024-02-15 MED ORDER — OXYCODONE HCL 5 MG/5ML PO SOLN: 5.0000 mg | Freq: Once | ORAL | Status: DC | PRN

## 2024-02-15 MED ORDER — FENTANYL CITRATE (PF) 100 MCG/2ML IJ SOLN
INTRAMUSCULAR | Status: AC
  Filled 2024-02-15: qty 2

## 2024-02-15 MED ORDER — ROSUVASTATIN CALCIUM 20 MG PO TABS
20.0000 mg | ORAL_TABLET | Freq: Every day | ORAL | Status: DC
  Administered 2024-02-16 – 2024-02-17 (×2): 20 mg via ORAL
  Filled 2024-02-15 (×2): qty 1
  Filled 2024-02-15: qty 2

## 2024-02-15 MED ORDER — HYDRALAZINE HCL 20 MG/ML IJ SOLN: 5.0000 mg | INTRAMUSCULAR | Status: DC | PRN

## 2024-02-15 MED ORDER — GENTAMICIN SULFATE 40 MG/ML IJ SOLN
INTRAMUSCULAR | Status: DC | PRN
  Administered 2024-02-15: 160 mg

## 2024-02-15 MED ORDER — VANCOMYCIN HCL 1000 MG IV SOLR
INTRAVENOUS | Status: AC
  Filled 2024-02-15: qty 20

## 2024-02-15 MED ORDER — SORBITOL 70 % SOLN: 30.0000 mL | Freq: Every day | Status: DC | PRN

## 2024-02-15 MED ORDER — HEPARIN SODIUM (PORCINE) 5000 UNIT/ML IJ SOLN
INTRAMUSCULAR | Status: AC
  Filled 2024-02-15: qty 1

## 2024-02-15 MED ORDER — PHENYLEPHRINE 80 MCG/ML (10ML) SYRINGE FOR IV PUSH (FOR BLOOD PRESSURE SUPPORT)
PREFILLED_SYRINGE | INTRAVENOUS | Status: DC | PRN
  Administered 2024-02-15 (×3): 80 ug via INTRAVENOUS

## 2024-02-15 MED ORDER — HEPARIN 30,000 UNITS/1000 ML (OHS) CELLSAVER SOLUTION
Status: AC | PRN
  Administered 2024-02-15: 1

## 2024-02-15 MED ORDER — DOCUSATE SODIUM 100 MG PO CAPS
100.0000 mg | ORAL_CAPSULE | Freq: Every day | ORAL | Status: DC
  Administered 2024-02-16 – 2024-02-17 (×2): 100 mg via ORAL
  Filled 2024-02-15 (×2): qty 1

## 2024-02-15 MED ORDER — FENTANYL CITRATE (PF) 100 MCG/2ML IJ SOLN
INTRAMUSCULAR | Status: DC | PRN
  Administered 2024-02-15: 50 ug via INTRAVENOUS
  Administered 2024-02-15: 150 ug via INTRAVENOUS
  Administered 2024-02-15 (×2): 50 ug via INTRAVENOUS
  Administered 2024-02-15: 100 ug via INTRAVENOUS
  Administered 2024-02-15: 50 ug via INTRAVENOUS

## 2024-02-15 MED ORDER — MORPHINE SULFATE (PF) 2 MG/ML IV SOLN: 2.0000 mg | INTRAVENOUS | Status: DC | PRN

## 2024-02-15 MED ORDER — EPHEDRINE 5 MG/ML INJ
INTRAVENOUS | Status: AC
  Filled 2024-02-15: qty 5

## 2024-02-15 MED ORDER — OXYCODONE HCL 5 MG PO TABS: 5.0000 mg | ORAL_TABLET | Freq: Once | ORAL | Status: DC | PRN

## 2024-02-15 MED ORDER — LIDOCAINE HCL (CARDIAC) PF 100 MG/5ML IV SOSY
PREFILLED_SYRINGE | INTRAVENOUS | Status: DC | PRN
  Administered 2024-02-15: 40 mg via INTRAVENOUS

## 2024-02-15 MED ORDER — PHENOL 1.4 % MT LIQD: 1.0000 | OROMUCOSAL | Status: DC | PRN

## 2024-02-15 MED ORDER — VASHE WOUND IRRIGATION OPTIME
TOPICAL | Status: DC | PRN
  Administered 2024-02-15: 34 [oz_av] via TOPICAL

## 2024-02-15 MED ORDER — SODIUM CHLORIDE 0.9 % IV SOLN: 500.0000 mL | Freq: Once | INTRAVENOUS | Status: DC | PRN

## 2024-02-15 MED ORDER — POTASSIUM CHLORIDE CRYS ER 20 MEQ PO TBCR: 40.0000 meq | EXTENDED_RELEASE_TABLET | Freq: Every day | ORAL | Status: DC | PRN

## 2024-02-15 MED ORDER — LACTATED RINGERS IV SOLN: INTRAVENOUS | Status: DC

## 2024-02-15 MED ORDER — INSULIN ASPART 100 UNIT/ML IJ SOLN: 0.0000 [IU] | Freq: Three times a day (TID) | INTRAMUSCULAR | Status: DC

## 2024-02-15 MED ORDER — ACETAMINOPHEN 650 MG RE SUPP: 325.0000 mg | RECTAL | Status: DC | PRN

## 2024-02-15 MED ORDER — ACETAMINOPHEN 10 MG/ML IV SOLN
INTRAVENOUS | Status: AC
  Filled 2024-02-15: qty 100

## 2024-02-15 MED ORDER — ONDANSETRON HCL 4 MG/2ML IJ SOLN
INTRAMUSCULAR | Status: DC | PRN
  Administered 2024-02-15 (×2): 4 mg via INTRAVENOUS

## 2024-02-15 MED ORDER — ALBUMIN HUMAN 5 % IV SOLN
INTRAVENOUS | Status: AC
  Filled 2024-02-15: qty 500

## 2024-02-15 MED ORDER — CHLORHEXIDINE GLUCONATE 0.12 % MT SOLN
OROMUCOSAL | Status: AC
Start: 2024-02-15 — End: 2024-02-15
  Filled 2024-02-15: qty 15

## 2024-02-15 MED ORDER — DEXMEDETOMIDINE HCL IN NACL 80 MCG/20ML IV SOLN
INTRAVENOUS | Status: AC
  Filled 2024-02-15: qty 20

## 2024-02-15 MED ORDER — LABETALOL HCL 5 MG/ML IV SOLN: 10.0000 mg | INTRAVENOUS | Status: DC | PRN

## 2024-02-15 MED ORDER — ACETAMINOPHEN 10 MG/ML IV SOLN
INTRAVENOUS | Status: DC | PRN
  Administered 2024-02-15: 1000 mg via INTRAVENOUS

## 2024-02-15 MED ORDER — ROCURONIUM BROMIDE 100 MG/10ML IV SOLN
INTRAVENOUS | Status: DC | PRN
  Administered 2024-02-15 (×3): 10 mg via INTRAVENOUS
  Administered 2024-02-15: 30 mg via INTRAVENOUS
  Administered 2024-02-15: 10 mg via INTRAVENOUS
  Administered 2024-02-15: 50 mg via INTRAVENOUS
  Administered 2024-02-15 (×2): 10 mg via INTRAVENOUS
  Administered 2024-02-15: 20 mg via INTRAVENOUS

## 2024-02-15 MED ORDER — SODIUM CHLORIDE 0.9 % IV SOLN
INTRAVENOUS | Status: DC | PRN
  Administered 2024-02-15: 501 mL via SURGICAL_CAVITY

## 2024-02-15 MED ORDER — STERILE WATER FOR IRRIGATION IR SOLN
Status: DC | PRN
  Administered 2024-02-15: 500 mL

## 2024-02-15 MED ORDER — FENTANYL CITRATE (PF) 250 MCG/5ML IJ SOLN
INTRAMUSCULAR | Status: AC
  Filled 2024-02-15: qty 5

## 2024-02-15 MED ORDER — FENTANYL CITRATE (PF) 100 MCG/2ML IJ SOLN: 25.0000 ug | INTRAMUSCULAR | Status: DC | PRN

## 2024-02-15 MED ORDER — ORAL CARE MOUTH RINSE: 15.0000 mL | Freq: Once | OROMUCOSAL | Status: AC

## 2024-02-15 MED ORDER — CEFAZOLIN SODIUM-DEXTROSE 2-4 GM/100ML-% IV SOLN
2.0000 g | Freq: Three times a day (TID) | INTRAVENOUS | Status: AC
  Administered 2024-02-15 – 2024-02-16 (×2): 2 g via INTRAVENOUS
  Filled 2024-02-15 (×2): qty 100

## 2024-02-15 SURGICAL SUPPLY — 83 items
BAG DECANTER FOR FLEXI CONT (MISCELLANEOUS) ×4 IMPLANT
BALLOON DORADO 10X40X80 (BALLOONS) IMPLANT
BALLOON DORADO 8X40X80 (BALLOONS) IMPLANT
BALLOON LUTONIX DCB 6X100X130 (BALLOONS) IMPLANT
BLADE SURG 15 STRL LF DISP TIS (BLADE) ×2 IMPLANT
BLADE SURG SZ11 CARB STEEL (BLADE) ×2 IMPLANT
BRUSH SCRUB EZ 4% CHG (MISCELLANEOUS) ×2 IMPLANT
CATH ACCU-VU SIZ PIG 5F 70CM (CATHETERS) IMPLANT
CATH BALLN CODA 9X100X32 (BALLOONS) IMPLANT
CATH KUMPE SOFT-VU 5FR 65 (CATHETERS) IMPLANT
CATH SLIP 5FR 0.38 X 40 KMP (CATHETERS) IMPLANT
CHLORAPREP W/TINT 26 (MISCELLANEOUS) ×2 IMPLANT
CLAMP SUTURE YELLOW 5 PAIRS (MISCELLANEOUS) ×2 IMPLANT
CLEANSER WND VASHE 34 (WOUND CARE) IMPLANT
CLIP APPLIE 13 LRG OPEN (CLIP) IMPLANT
DERMABOND ADVANCED .7 DNX12 (GAUZE/BANDAGES/DRESSINGS) ×2 IMPLANT
DEVICE PRESTO INFLATION (MISCELLANEOUS) IMPLANT
DILATOR VESSEL 10FR 20CM (INTRODUCER) IMPLANT
DILATOR VESSEL 38 20CM 12FR (INTRODUCER) IMPLANT
DRAPE INCISE IOBAN 66X45 STRL (DRAPES) ×2 IMPLANT
DRAPE INCISE IOBAN 66X60 STRL (DRAPES) IMPLANT
DRESSING SURGICEL FIBRLLR 1X2 (HEMOSTASIS) ×2 IMPLANT
DRSG OPSITE POSTOP 4X6 (GAUZE/BANDAGES/DRESSINGS) IMPLANT
ELECT CAUTERY BLADE 6.4 (BLADE) IMPLANT
ELECTRODE REM PT RTRN 9FT ADLT (ELECTROSURGICAL) ×2 IMPLANT
GLIDEWIRE ADV .035X180CM (WIRE) IMPLANT
GLOVE BIO SURGEON STRL SZ7 (GLOVE) ×4 IMPLANT
GLOVE SURG SYN 8.0 PF PI (GLOVE) ×2 IMPLANT
GOWN STRL REUS W/ TWL LRG LVL3 (GOWN DISPOSABLE) ×2 IMPLANT
GOWN STRL REUS W/ TWL XL LVL3 (GOWN DISPOSABLE) ×2 IMPLANT
GOWN STRL REUS W/TWL 2XL LVL3 (GOWN DISPOSABLE) ×2 IMPLANT
GRAFT VASC PATCH XENOSURE 1X14 (Vascular Products) IMPLANT
HEMOSTAT HEMOBLAST BELLOWS (HEMOSTASIS) IMPLANT
INTRODUCER 7FR 23CM (INTRODUCER) IMPLANT
IV NS 500ML BAXH (IV SOLUTION) ×2 IMPLANT
KIT ATRIEVE SNARE 18-30 7FR (MISCELLANEOUS) IMPLANT
KIT STIMULAN RAPID CURE 5CC (Orthopedic Implant) IMPLANT
KIT TURNOVER KIT A (KITS) ×2 IMPLANT
LABEL OR SOLS (LABEL) ×2 IMPLANT
LOOP VESSEL MAXI 1X406 RED (MISCELLANEOUS) ×4 IMPLANT
LOOP VESSEL MINI 0.8X406 BLUE (MISCELLANEOUS) ×6 IMPLANT
MANIFOLD NEPTUNE II (INSTRUMENTS) ×2 IMPLANT
NDL HYPO 18GX1.5 BLUNT FILL (NEEDLE) ×2 IMPLANT
NDL SAFETY ECLIPSE 18X1.5 (NEEDLE) ×2 IMPLANT
NEEDLE HYPO 18GX1.5 BLUNT FILL (NEEDLE) ×2 IMPLANT
NS IRRIG 500ML POUR BTL (IV SOLUTION) ×2 IMPLANT
PACK ANGIOGRAPHY (CUSTOM PROCEDURE TRAY) IMPLANT
PACK BASIN MAJOR ARMC (MISCELLANEOUS) ×2 IMPLANT
PACK UNIVERSAL (MISCELLANEOUS) ×2 IMPLANT
PENCIL SMOKE EVACUATOR (MISCELLANEOUS) IMPLANT
SET WALTER ACTIVATION W/DRAPE (SET/KITS/TRAYS/PACK) ×2 IMPLANT
SHEATH BRITE TIP 7FRX11 (SHEATH) IMPLANT
SLEEVE PROTECTION STRL DISP (MISCELLANEOUS) IMPLANT
SPIKE FLUID TRANSFER (MISCELLANEOUS) ×2 IMPLANT
STAPLER SKIN PROX 35W (STAPLE) ×2 IMPLANT
STENT GRAFT AFX 22X60/13X14 (Endovascular Graft) IMPLANT
STENT LIFESTREAM 9X38X80 (Permanent Stent) IMPLANT
STENT LIFESTREAM 9X58X80 (Permanent Stent) IMPLANT
SUT MNCRL+ 5-0 UNDYED PC-3 (SUTURE) ×2 IMPLANT
SUT PROLENE 5 0 RB 1 DA (SUTURE) ×8 IMPLANT
SUT PROLENE 6 0 BV (SUTURE) ×12 IMPLANT
SUT PROLENE 7 0 BV 1 (SUTURE) ×8 IMPLANT
SUT SILK 2-0 18XBRD TIE 12 (SUTURE) ×2 IMPLANT
SUT SILK 3-0 18XBRD TIE 12 (SUTURE) ×2 IMPLANT
SUT SILK 4-0 18XBRD TIE 12 (SUTURE) IMPLANT
SUT VIC AB 2-0 CT1 (SUTURE) IMPLANT
SUT VIC AB 2-0 CT1 TAPERPNT 27 (SUTURE) ×4 IMPLANT
SUT VIC AB 3-0 SH 27X BRD (SUTURE) ×2 IMPLANT
SUT VICRYL+ 3-0 36IN CT-1 (SUTURE) ×4 IMPLANT
SUTURE EHLN 3-0 FS-10 30 BLK (SUTURE) ×2 IMPLANT
SYR 20ML LL LF (SYRINGE) ×2 IMPLANT
SYR 3ML LL SCALE MARK (SYRINGE) IMPLANT
SYR 5ML LL (SYRINGE) ×2 IMPLANT
SYR MEDRAD MARK 7 150ML (SYRINGE) IMPLANT
TRAP FLUID SMOKE EVACUATOR (MISCELLANEOUS) ×2 IMPLANT
TRAY FOLEY MTR SLVR 16FR STAT (SET/KITS/TRAYS/PACK) ×2 IMPLANT
TRAY FOLEY SLVR 16FR LF STAT (SET/KITS/TRAYS/PACK) ×2 IMPLANT
TUBING CONTRAST HIGH PRESS 72 (TUBING) IMPLANT
WATER STERILE IRR 1000ML POUR (IV SOLUTION) ×2 IMPLANT
WATER STERILE IRR 500ML POUR (IV SOLUTION) ×2 IMPLANT
WIRE AMPLATZ SSTIFF .035X260CM (WIRE) IMPLANT
WIRE G LUND 35X260X7 (WIRE) IMPLANT
WIRE J 3MM .035X145CM (WIRE) IMPLANT

## 2024-02-15 NOTE — Transfer of Care (Signed)
 Immediate Anesthesia Transfer of Care Note  Patient: Tony Solomon  Procedure(s) Performed: ENDARTERECTOMY, FEMORAL (Bilateral) INSERTION, ENDOVASCULAR STENT GRAFT, AORTA, ABDOMINAL APPLICATION OF CELL SAVER  Patient Location: PACU  Anesthesia Type:General  Level of Consciousness: awake and patient cooperative  Airway & Oxygen Therapy: Patient Spontanous Breathing  Post-op Assessment: Report given to RN and Post -op Vital signs reviewed and stable  Post vital signs: stable  Last Vitals:  Vitals Value Taken Time  BP    Temp    Pulse    Resp    SpO2      Last Pain:  Vitals:   02/15/24 0623  TempSrc: Temporal  PainSc: 0-No pain         Complications: No notable events documented.

## 2024-02-15 NOTE — Op Note (Signed)
 OPERATIVE NOTE   PROCEDURE: Bilateral common femoral, superficial femoral and profunda femoris endarterectomy with bovine pericardial patch angioplasty. Open angioplasty and stent placement of the abdominal aorta and bilateral common iliac arteries using a combination of the 22 x 60 by 13 x 40Endologix stent graft with individual Lifestream stents for the bilateral common iliac arteries. Placement of 9 mm diameter by 58 mm length Lifestream stent on the right and placement of 9 mm diameter by 37 mm length Lifestream stent on the left   PRE-OPERATIVE DIAGNOSIS: Atherosclerotic occlusive disease bilateral lower extremities with lifestyle limiting claudication and rest pain symptoms right leg more than left.  POST-OPERATIVE DIAGNOSIS: Same  CO-SURGEON: Cordella JUDITHANN Shawl, MD and Selinda CANDIE Gu, M.D.  ASSISTANT(S): None  ANESTHESIA: general  ESTIMATED BLOOD LOSS: 350 cc  FINDING(S): Profound calcific plaque noted bilaterally extending past the initial bifurcation of the profunda femoris arteries as well as down the extensive length of the SFA  SPECIMEN(S):  Calcific plaque from the common femoral, superficial femoral and the profunda femoris arteries bilaterally  INDICATIONS:   Tony Solomon 73 y.o. y.o.male who presents with complaints of lifestyle limiting claudication and pain continuously in the feet bilaterally. The patient has documented severe atherosclerotic occlusive disease and has undergone multiple minimally invasive treatments in the past. However, at this point his primary area of stricture stenosis resides in the common femoral and origins of the superficial femoral and profunda femoris extending into these arteries and therefore this is not amenable to intervention and he is now undergoing open endarterectomy. The risks and benefits of been reviewed with the patient, all questions have answered; alternative therapies have been reviewed as well and the patient has agreed  to proceed with surgical open repair.  DESCRIPTION: After obtaining full informed written consent, the patient was brought back to the operating room and placed supine upon the operating table.  The patient received IV antibiotics prior to induction.  After obtaining adequate anesthesia, the patient was prepped and draped in the standard fashion for: bilateral femoral exposure.  Co-surgeons are required because this is a very complex bilateral procedure with work being performed simultaneously from both the right femoral and left femoral approach.  This also expedites the procedure making a shorter operative time reducing complications and improving patient safety.  Attention was turned to the bilateral groins with Dr. Gu working on the patient's right and myself working on the left of the patient.  Vertical  incisions were made over the common femoral artery and dissected down to the common femoral artery with electrocautery.  I dissected out the common femoral artery from the distal external iliac artery (identified by the superficial circumflex vessels) down to the femoral bifurcation.  On initial inspection, the common femoral artery was: Densely calcified and there was no palpable pulse noted bilaterally.    Subsequently the dissection was continued to include all circumflex branches and the profunda femoral artery and superficial femoral artery. The superficial femoral artery was dissected circumferentially for a distance of approximately 3-4 cm and the profunda femoris was dissected circumferentially out to the fourth order branches individual vessel loops were placed around each branch. Both of the groins were treated simultaneously as described above. Control of all branches was obtained with vessel loops.  A softer area in the distal external iliac artery amendable to clamping was identified.    The patient was given 7000 units of Heparin  intravenously additional boluses were given at approximately  1 hour intervals, which  was a therapeutic bolus.     At this point while there was still some element of forward flow we elected to bring in the C arm with Dr. Marea working on the right and myself working on the left access was obtained to the proximal common femoral arteries using a Seldinger needle.  Advantage wires were then advanced under fluoroscopic guidance.  7 French sheaths were placed.  Using a Kumpe catheter with the advantage wire we were able to cross the distal aortic and bilateral iliac occlusions.  Hand-injection of contrast through both the right Kumpe and the left Kumpe verified intraluminal positioning.  At this point the advantage wire was reintroduced and left in place.  The sheaths and Kumpe catheters were removed.  We then returned to performing the endarterectomies.  The right distal external iliac artery was clamped and we placed all the Silastic Vesseloops on the circumflex branches, the profunda and superficial femoral arteries under tension.  Arteriotomy was made in the right common femoral artery with a 11-blade and extended it with a Potts scissor proximally and distally extending the distal end down the SFA for approximately 2-3 cm.   Endarterectomy was then performed under direct visualization using a freer elevator and a right angle from the mid common femoral extending up both proximally and distally. Proximally the endarterectomy was brought up to the level of the clamp where a clean edge was obtained. Distally the endarterectomy was carried down to a soft spot in the SFA where a feathered edge would was obtained.  7-0 Prolene interrupted tacking sutures were placed to secure the leading edge of the plaque in the SFA.  The right profunda femoris was treated with an eversion technique extending the endarterectomy approximately 2 cm distally again obtaining a featheredge.   At this point, we fashioned a bovine pericardial patch for the geometry of the arteriotomy.  The patch  was sewn to the artery with 2 running stitches of 6-0 Prolene, running from each end.  A small gap was left in the lateral wall of the patch repair with the wire extending from this.  This area was secured using 2 interrupted 6-0 Prolene sutures to allow for placement of the sheath over the wire.  7 French sheath was placed.  At this point attention was turned to the opposite groin.  The right distal external iliac artery was clamped and we placed all the Silastic Vesseloops on the circumflex branches, the profunda and superficial femoral arteries under tension.  Arteriotomy was made in the right common femoral artery with a 11-blade and extended it with a Potts scissor proximally and distally extending the distal end down the SFA for approximately 3 cm.   Endarterectomy was then performed under direct visualization using a freer elevator and a right angle from the mid common femoral extending up both proximally and distally. Proximally the endarterectomy was brought up to the level of the clamp where a clean edge was obtained. Distally the endarterectomy was carried down to a soft spot in the SFA where a feathered edge would was obtained.  7-0 Prolene interrupted tacking sutures were placed to secure the leading edge of the plaque in the SFA.  The right profunda femoris was treated with an eversion technique extending the endarterectomy approximately 2 cm distally again obtaining a featheredge.   At this point, we fashioned a bovine pericardial patch for the geometry of the arteriotomy.  The patch was sewn to the artery with 2 running stitches of 6-0 Prolene,  running from each end.  In a similar fashion the medial wall was completed.  Using 2 interrupted 6-0 Prolene to the lateral wall a gap was left and a 7 [place] sheath was placed.  We then began the interventional portion of the case.  Working through the sheaths using advantage wires and Kumpe catheters we were able to negotiate wires and catheters  into the abdominal aorta.  Hand-injection of contrast demonstrated intraluminal positioning.  Working on the left we then dilated the common iliac artery with a 6 mm balloon inflation.  Following this 42 [place] then 45 [place] then 14 [place] dilators were advanced over the wire.  Using this image, we selected a a 22 x 60 by 13 x 40 Endologix device device.  A Lunderquist wire was then advanced up the left side.  The delivery sheath was advanced over a stiff wire and position with the tip of the sheath just above the aortic bifurcation.   The 22 x 60 by 13 x 40 main body was then placed into the delivery sheath and the contralateral snare wire advanced through the delivery sheath and positioned at the tip of the sheath.  Working from the right side a trilobed snare was advanced through the 7 French sheath and positioned several centimeters above the tip of the delivery sheath.  The contralateral snare wire was then advanced through the snare and the snare used to capture the contralateral wire.  The wire was then fed from the ipsilateral side while it was pulled out the 7 [place] sheath.  Taking care and making adjustments to ensure there was no wire wrap.  The Endologix main body was then advanced completely out of the delivery sheath again ensuring that there was no wire wrap.  Subsequently working from both the right and left sides the main body was pulled down to seated squarely on the aortic bifurcation.  The outer yellow catheter was then removed from the contralateral wire opening the contralateral limb of the main body.  Pigtail catheter was then advanced up the main wire into the proximal infrarenal aorta.  Snare wire was then removed.  The main body deployment was then completed.  A Amplatz wire was then advanced through the pigtail catheter on the right side.  Given the profound calcification and the occlusive nature of his disease along with the fact that the Endologix main body did not fully unconstrained  we selected a 9 mm x 58 mm Lifestream stent for the right and a 9 mm x 37 mm length Lifestream stent for the left.  These were then delivered up so that they elevated the flow divider of the Endologix by only 3 or so millimeters.  They were deployed simultaneously in a kissing balloon technique.  Subsequently a 10 mm balloon was used to post dilate the left side.  A Coda balloon was then advanced up the left side and the main body angioplastied using the Coda balloon.  The catheter was then advanced through the 7 French sheath and positioned just above the renal arteries.  Bolus injection contrast was then performed to image the reconstruction.  Following this aortic injection we then retrograde injected through the sheaths to evaluate the external iliacs.  The aorta bilateral common iliacs and external iliac arteries are now well treated with less than 10% residual stenosis.  The internal iliac artery is patent on the left.  We then elected to move forward with completing the endarterectomy patch angioplasty reconstruction.  Prior to completing  the patch angioplasty, the profunda femoral artery was flushed as was the superficial femoral artery. The system was then forward flushed. The endarterectomy site was then irrigated copiously with heparinized saline.   The bilateral patch angioplasty was completed in the usual fashion.  Flow was then reestablished first to the profunda femoris and then the superficial femoral artery. Any gaps or bleeding sites in the suture line were easily controlled with a 6-0 Prolene suture.   Both right and left groins were then irrigated copiously with Vashe and subsequently hemoblast and fibrillar were placed in the wound. The incision was repaired with a double layer of 2-0 Vicryl, a double layer of 3-0 Vicryl, and the skin was closed with staples.  Prevena disposable vacs were then applied to both groin incisions.  COMPLICATIONS: None  CONDITION: Tony Solomon,  M.D. [facility] Office: [phone]  02/15/2024, 1:26 PM

## 2024-02-15 NOTE — Plan of Care (Signed)
  Problem: Education: Goal: Knowledge of General Education information will improve Description: Including pain rating scale, medication(s)/side effects and non-pharmacologic comfort measures Outcome: Progressing   Problem: Health Behavior/Discharge Planning: Goal: Ability to manage health-related needs will improve Outcome: Progressing   Problem: Clinical Measurements: Goal: Ability to maintain clinical measurements within normal limits will improve Outcome: Progressing Goal: Will remain free from infection Outcome: Progressing Goal: Diagnostic test results will improve Outcome: Progressing Goal: Respiratory complications will improve Outcome: Progressing Goal: Cardiovascular complication will be avoided Outcome: Progressing   Problem: Activity: Goal: Risk for activity intolerance will decrease Outcome: Progressing   Problem: Nutrition: Goal: Adequate nutrition will be maintained Outcome: Progressing   Problem: Coping: Goal: Level of anxiety will decrease Outcome: Progressing   Problem: Elimination: Goal: Will not experience complications related to bowel motility Outcome: Progressing Goal: Will not experience complications related to urinary retention Outcome: Progressing   Problem: Pain Managment: Goal: General experience of comfort will improve and/or be controlled Outcome: Progressing   Problem: Safety: Goal: Ability to remain free from injury will improve Outcome: Progressing   Problem: Skin Integrity: Goal: Risk for impaired skin integrity will decrease Outcome: Progressing   Problem: Education: Goal: Knowledge of prescribed regimen will improve Outcome: Progressing   Problem: Activity: Goal: Ability to tolerate increased activity will improve Outcome: Progressing   Problem: Bowel/Gastric: Goal: Gastrointestinal status for postoperative course will improve Outcome: Progressing   Problem: Clinical Measurements: Goal: Postoperative complications  will be avoided or minimized Outcome: Progressing Goal: Signs and symptoms of graft occlusion will improve Outcome: Progressing   Problem: Skin Integrity: Goal: Demonstration of wound healing without infection will improve Outcome: Progressing   Problem: Education: Goal: Ability to describe self-care measures that may prevent or decrease complications (Diabetes Survival Skills Education) will improve Outcome: Progressing Goal: Individualized Educational Video(s) Outcome: Progressing   Problem: Coping: Goal: Ability to adjust to condition or change in health will improve Outcome: Progressing   Problem: Fluid Volume: Goal: Ability to maintain a balanced intake and output will improve Outcome: Progressing   Problem: Health Behavior/Discharge Planning: Goal: Ability to identify and utilize available resources and services will improve Outcome: Progressing Goal: Ability to manage health-related needs will improve Outcome: Progressing   Problem: Metabolic: Goal: Ability to maintain appropriate glucose levels will improve Outcome: Progressing   Problem: Nutritional: Goal: Maintenance of adequate nutrition will improve Outcome: Progressing Goal: Progress toward achieving an optimal weight will improve Outcome: Progressing   Problem: Skin Integrity: Goal: Risk for impaired skin integrity will decrease Outcome: Progressing   Problem: Tissue Perfusion: Goal: Adequacy of tissue perfusion will improve Outcome: Progressing

## 2024-02-15 NOTE — Anesthesia Procedure Notes (Addendum)
 Procedure Name: Intubation Date/Time: 02/15/2024 8:47 AM  Performed by: Derick Jansky, RNPre-anesthesia Checklist: Patient identified, Emergency Drugs available, Suction available and Patient being monitored Patient Re-evaluated:Patient Re-evaluated prior to induction Oxygen Delivery Method: Circle system utilized Preoxygenation: Pre-oxygenation with 100% oxygen Induction Type: IV induction Ventilation: Mask ventilation without difficulty and Oral airway inserted - appropriate to patient size Laryngoscope Size: McGrath and 3 Tube type: Oral Tube size: 7.0 mm Number of attempts: 1 Airway Equipment and Method: Stylet and Oral airway Placement Confirmation: ETT inserted through vocal cords under direct vision, positive ETCO2 and breath sounds checked- equal and bilateral Secured at: 22 cm Tube secured with: Tape Dental Injury: Teeth and Oropharynx as per pre-operative assessment

## 2024-02-15 NOTE — Anesthesia Postprocedure Evaluation (Signed)
 Anesthesia Post Note  Patient: Tony Solomon  Procedure(s) Performed: ENDARTERECTOMY, FEMORAL (Bilateral) INSERTION, ENDOVASCULAR STENT GRAFT, AORTA, ABDOMINAL APPLICATION OF CELL SAVER  Patient location during evaluation: PACU Anesthesia Type: General Level of consciousness: awake and alert Pain management: pain level controlled Vital Signs Assessment: post-procedure vital signs reviewed and stable Respiratory status: spontaneous breathing, nonlabored ventilation and respiratory function stable Cardiovascular status: blood pressure returned to baseline and stable Postop Assessment: no apparent nausea or vomiting Anesthetic complications: no   There were no known notable events for this encounter.   Last Vitals:  Vitals:   02/15/24 1405 02/15/24 1426  BP:  (!) 158/67  Pulse: 70 78  Resp: 13 12  Temp:  (!) 36.4 C  SpO2: 97% 97%    Last Pain:  Vitals:   02/15/24 1426  TempSrc: Oral  PainSc: 0-No pain                 Fairy POUR Xianna Siverling

## 2024-02-15 NOTE — Op Note (Signed)
 OPERATIVE NOTE   PROCEDURE: Bilateral common femoral, profunda femoris, and superficial femoral artery endarterectomies and bovine pericardial patch angioplasty Catheter placement into aorta from bilateral femoral approaches Placement of a 22 mm diameter proximal, 60 mm proximal, 13 mm x 40 mm iliac limb Endologix aortic endoprosthesis main body left Placement of 9 mm diameter by 58 mm length Lifestream stent on the right and placement of 9 mm diameter by 37 mm length Lifestream stent on the left  PRE-OPERATIVE DIAGNOSIS: Atherosclerotic occlusive disease bilateral lower extremities with lifestyle limiting claudication and rest pain  POST-OPERATIVE DIAGNOSIS: same  SURGEON: Cordella Shawl, MD and Selinda Gu, MD - Co-surgeons  ANESTHESIA: general  ESTIMATED BLOOD LOSS: 350 cc  FINDING(S): 1.  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 y.o. male who presents with lifestyle limiting claudication and rest pain.  Work-up demonstrates the patient is a suitable candidate for aortobiiliac stenting using the Endologix stent graft with concomitant bilateral femoral endarterectomies.  The risks and benefits have been reviewed with the patient alternative therapies including open aortobifemoral bypass was discussed.  All questions have been answered.  The patient is voicing severe lifestyle limitation and therefore wishes to proceed with endograft repair.  DESCRIPTION: After obtaining full informed written consent, the patient was brought back to the operating room and placed supine upon the operating table.  The patient received IV antibiotics prior to induction.  After obtaining adequate anesthesia, the patient was prepped and draped in the standard fashion for endovascular aortoiliac stent graft placement.  Co-surgeons are required because this is a  complex bilateral procedure with work being performed simultaneously from both the right femoral and left femoral approach.  This also expedites the procedure making a shorter operative time reducing complications and improving patient safety.  Vertical incisions created overlying both femoral arteries.  I worked on the right and Dr. Shawl worked on the left.  We dissected down to the common femoral artery and the femoral bifurcation.  We dissected out the profunda femoris artery primary branches in about 4 to 5 cm at the superficial femoral artery for control.  The circumflex vessels were also controlled proximally and we prepared for proximal control in the common femoral arteries.  The patient was then given 7000 units of intravenous heparin .  Additional heparin  was given as needed during the procedure.  Prior to performing the endarterectomies, we both accessed the femoral arteries with a Seldinger needle and placed an advantage wire and 7 French sheath.  We were able to navigate through the occlusions in the common iliac arteries and distal aorta bilaterally confirming intraluminal flow in the aorta at the level of the renal arteries.  Advantage wires were then placed at both sides and the sheaths were removed.  These were parked in the suprarenal aorta proximally.  We then pulled up control and the Vesseloops and proceeded with the endarterectomies on both sides.  Attention was turned to the right side first.  A Freer elevator was used after extending the arteriotomy down to the proximal superficial femoral artery.  A fairly extensive eversion endarterectomy was required 2 to 3 cm down the profunda femoris artery to remove the proximal plaque with a good endpoint seen.  The elevator was used to take the superficial femoral artery plaque a centimeter to down into the artery and a good feathered distal endpoint was seen.  The distal endpoint of the profunda femoris artery was tacked down with a pair of  7-0 Prolene sutures and the superficial femoral artery endpoint was tacked down with 7-0 Prolene suture as well.  All loose flecks were removed.  A bovine pericardial patch was cut and beveled to match the arteriotomy.  6-0 Prolene was started at the distal endpoint of the right and right half the length of the arteriotomy medially and laterally.  An interrupted 6-0 Prolene suture was placed on either side.  A second 6-0 Prolene suture was run medially to complete the suture line and laterally to leave a short gap for the sheath where the wire was currently placed.  Attention was then turned to the left side.  The arteriotomy was extended down about 1 to 2 cm onto the superficial femoral artery.  The Therapist, nutritional was used to create a nice plane and remove the plaque from the common femoral and the most proximal superficial femoral artery.  A small amount of plaque was also removed in an eversion fashion from the origin of the profunda femoris artery.  Nice feathered distal endpoints were seen.  We then brought a bovine pericardial patch onto the field.  This was cut and beveled to match the arteriotomy.  This was started at the distal endpoint and run one half the length of the suture line medially and laterally with an interrupted 6-0 Prolene placed on either side at this point.  A second 6-0 Prolene was started at the proximal suture line and run medially to complete the suture line and laterally to leave a gap for sheath placement.  7 French sheath were then placed on each side.  We predilated the iliac arteries with 6 mm diameter angioplasty balloons in the common iliac artery on each side.  We then used serial dilators on the left side up to a 12 dilator without difficulty.  The Pigtail catheter was placed into the aorta from the right side. Using this image, we selected a 22 mm proximal, 60 mm long proximal Endologix device device.  This had 13 mm diameter by 40 mm iliac limbs on each side.  A  Lunderquist wire was then advanced up the left side.  The delivery sheath was advanced over a stiff wire and position with the tip of the sheath just above the aortic bifurcation.   The 22 mm diameter by 60 cm Endologix main body was then placed into the delivery sheath and the contralateral snare wire advanced through the delivery sheath and positioned at the tip of the sheath.  Working from the right side a trilobed snare was advanced through the 7 French sheath and positioned several centimeters above the tip of the delivery sheath.  The contralateral snare wire was then advanced through the snare and the snare used to capture the contralateral wire.  The wire was then fed from the ipsilateral side while it was pulled out the 7 [place] sheath.  Taking care and making adjustments to ensure there was no wire wrap.  The Endologix main body was then advanced completely out of the delivery sheath again ensuring that there was no wire wrap.  Subsequently working from both the right and left sides the main body was pulled down to seated squarely on the aortic bifurcation.  The outer yellow catheter was then removed from the contralateral wire opening the contralateral limb of the main body.  Pigtail catheter was then advanced up the main wire into the proximal infrarenal aorta.  Snare wire was then removed.  The main body deployment was then completed.  A Magic torque wire was then advanced through the pigtail catheter on the right side.  Angioplasty balloons were then selected to post dilate the stent graft, a Coda balloon was selected for the aorta.  An 8 mm diameter balloon was selected for the right limb and a 8 mm diameter balloon was selected for the left.  Simultaneous inflations to 12 atmospheres were then performed. The pigtail catheter was then advanced through the 7 French sheath and positioned just above the renal arteries.  Bolus injection contrast was then performed to image the reconstruction.  Upon  review of the bolus injection through the pigtail further stents were needed.  9 mm diameter by 58 mm length Lifestream stent was placed on the right side a 9 mm diam by 38 mm length Lifestream stent was placed on the left side.  These were taken up to the most proximal common iliac arteries and brought out just below the Endologix stent graft.  These were then postdilated with 10 mm balloons and completion imaging showed less than 10% residual stenosis in both common iliac arteries and the aorta after Endologix stent graft and Lifestream stent placements.  Retrograde imaging was performed through both femoral sheaths and no further treatment was needed in the external iliac arteries on either side. The sheaths were then removed on each side.  We then proceeded with arterial closure and the cath in the arteriotomy was completed with 6-0 Prolene suture on both sides.  The vessels were flushed and de-aired prior to release of control and on release there was excellent pulse in both superficial femoral and profunda femoris arteries.    The wounds were then irrigated with Vashe irrigation.  Fibrillar and hemoblast were used for hemostatic agents hemostasis was complete.  Gentamicin  and vancomycin  impregnated antibiotic beads were then placed in both wounds.  Both wounds were then closed with 2 layers of running 2-0 Vicryl, 2 layers of running 3-0 Vicryl, and a 4-0 Monocryl for the skin.  Sterile dressings were placed.  The patient was taken to the recovery room in stable condition having tolerated the procedure well.  COMPLICATIONS: none  CONDITION: stable  Selinda Gu  02/15/2024, 1:06 PM

## 2024-02-15 NOTE — Progress Notes (Signed)
 MRN : [id number]  Tony Solomon is a 73 y.o. ([birth year]) male who presents with chief complaint of check circulation.  History of Present Illness:   The patient presents to [facility] for treatment of his atherosclerotic occlusive disease.  He was last seen in the office November 22, 2023.  He was seen for followup and review status post angiogram without intervention on 11/08/2023.    This procedure was largely diagnostic.  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 The right common femoral is essentially occluded beginning in the proximal common femoral artery throughout its entirety.    The patient notes no improvement in the lower extremity symptoms.  He has significant claudication with mild rest pain symptoms.   There have been no significant changes to the patient's overall health care.   No documented history of amaurosis fugax or recent TIA symptoms. There are no recent neurological changes noted. No documented history of DVT, PE or superficial thrombophlebitis. The patient denies recent episodes of angina or shortness of breath.   Current Meds  Medication Sig   aspirin  EC 81 MG tablet Take 81 mg by mouth daily. Swallow whole.   calcium  carbonate (TUMS - DOSED IN MG ELEMENTAL CALCIUM ) 500 MG chewable tablet Chew 1 tablet by mouth daily.   levothyroxine  (SYNTHROID ) 50 MCG tablet Take 50 mcg by mouth daily before breakfast.   Multiple Vitamins-Minerals (MULTIVITAMIN GUMMIES ADULT PO) Take by mouth.    rosuvastatin  (CRESTOR ) 20 MG tablet Take 20 mg by mouth daily.   tadalafil (CIALIS) 5 MG tablet Take 5 mg by mouth daily as needed.   telmisartan (MICARDIS) 80 MG tablet Take 80 mg by mouth daily.    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 (HCC)    Cancer (HCC)    skin cancer on face and hands   GERD (gastroesophageal reflux disease)    HTN, goal below 140/80    Hyperlipidemia    Hypertension    Hypothyroidism    Mixed hyperlipidemia    Thyroid  disease    Thyroid  disease    Vitamin D deficiency     Past Surgical History:  Procedure Laterality Date   APPENDECTOMY     at the age of 3   LOWER EXTREMITY ANGIOGRAPHY Right 11/08/2023   Procedure: Lower Extremity Angiography;  Surgeon: Jama Cordella MATSU, MD;  Location: [facility] INVASIVE CV LAB;  Service: Cardiovascular;  Laterality: Right;   LOWER EXTREMITY INTERVENTION Right 11/08/2023   Procedure: LOWER EXTREMITY INTERVENTION;  Surgeon: Jama Cordella MATSU, MD;  Location: [facility] INVASIVE CV LAB;  Service: Cardiovascular;  Laterality: Right;    Social History Social History   Tobacco Use   Smoking status: Former    Types:  Cigarettes    Start date: 1998  Substance Use Topics   Alcohol use: Yes    Alcohol/week: 26.0 - 27.0 standard drinks of alcohol    Types: 25 Cans of beer, 1 - 2 Standard drinks or equivalent per week    Family History Family History  Problem Relation Age of Onset   Cancer Mother    Heart disease Brother     No Known Allergies   REVIEW OF SYSTEMS (Negative unless checked)  Constitutional: [] Weight loss  [] Fever  [] Chills Cardiac: [] Chest pain   [] Chest pressure   [] Palpitations   [] Shortness of breath when laying flat   [] Shortness of breath with exertion. Vascular:  [x] Pain in legs with walking   [] Pain in legs at rest  [] History of DVT   [] Phlebitis   [] Swelling in legs   [] Varicose veins   [] Non-healing ulcers Pulmonary:   [] Uses home oxygen    [] Productive cough   [] Hemoptysis   [] Wheeze  [] COPD   [] Asthma Neurologic:  [] Dizziness   [] Seizures   [] History of stroke   [] History of TIA  [] Aphasia   [] Vissual changes   [] Weakness or numbness in arm   [] Weakness or numbness in leg Musculoskeletal:   [] Joint swelling   [] Joint pain   [] Low back pain Hematologic:  [] Easy bruising  [] Easy bleeding   [] Hypercoagulable state   [] Anemic Gastrointestinal:  [] Diarrhea   [] Vomiting  [] Gastroesophageal reflux/heartburn   [] Difficulty swallowing. Genitourinary:  [] Chronic kidney disease   [] Difficult urination  [] Frequent urination   [] Blood in urine Skin:  [] Rashes   [] Ulcers  Psychological:  [] History of anxiety   []  History of major depression.  Physical Examination  Vitals:   02/15/24 0623  BP: (!) 151/72  Pulse: 71  Resp: 16  Temp: 97.7 F (36.5 C)  TempSrc: Temporal  SpO2: 99%  Weight: 60.5 kg  Height: 5' 2 (1.575 m)   Body mass index is 24.38 kg/m. Gen: WD/WN, NAD Head: [place]/AT, No temporalis wasting.  Ear/Nose/Throat: Hearing grossly intact, nares w/o erythema or drainage Eyes: PER, EOMI, sclera nonicteric.  Neck: Supple, no masses.  No bruit or JVD.  Pulmonary:  Good air movement, no audible wheezing, no use of accessory muscles.  Cardiac: RRR, normal S1, S2, no Murmurs. Vascular:  mild trophic changes, no open wounds Vessel Right Left  Radial Palpable Palpable  PT Not Palpable Not Palpable  DP Not Palpable Not Palpable  Gastrointestinal: soft, non-distended. No guarding/no peritoneal signs.  Musculoskeletal: M/S 5/5 throughout.  No visible deformity.  Neurologic: CN 2-12 intact. Pain and light touch intact in extremities.  Symmetrical.  Speech is fluent. Motor exam as listed above. Psychiatric: Judgment intact, Mood & affect appropriate for pt's clinical situation. Dermatologic: No rashes or ulcers noted.  No changes consistent with cellulitis.   CBC Lab Results  Component Value Date   WBC 9.7 02/06/2024   HGB  14.0 02/06/2024   HCT 39.7 02/06/2024   MCV 91.7 02/06/2024   PLT 267 02/06/2024    BMET    Component Value Date/Time   NA 136 02/06/2024 1119   K 3.6 02/06/2024 1119   CL 96 (L) 02/06/2024 1119   CO2 27 02/06/2024 1119   GLUCOSE 100 (H) 02/06/2024 1119   BUN 13 02/06/2024 1119   CREATININE 0.82 02/06/2024 1119   CALCIUM  9.3 02/06/2024 1119   GFRNONAA >60 02/06/2024 1119   Estimated Creatinine Clearance: 60.1 mL/min (by C-G formula based on SCr of 0.82 mg/dL).  COAG No results found  for: INR, PROTIME  Radiology No results found.   Assessment/Plan 1. 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  This represents a limb threatening ischemia and places the patient at a high risk for limb loss.   Angiography has been performed and the situation is not ideal for intervention.  Given this finding open surgical repair is recommended.    Patient should undergo arterial reconstruction, bilateral femoral endarterectomies with Endologix stent graft.  It was also discussed that if we are unable to cross the aortic lesion that he may require a aortobifem bypass.  This is done with the hope for limb salvage.  The risks and benefits as well as the alternative therapies was discussed in detail with the patient.  All questions were answered.  Patient agrees to proceed with open vascular surgical reconstruction.   The patient will follow up with me in the office after the procedure.      2. Essential hypertension Continue antihypertensive medications as already ordered, these medications have been reviewed and there are no changes at this time.     Cordella Shawl, MD  02/15/2024 7:19 AM

## 2024-02-15 NOTE — H&P (View-Only) (Signed)
 MRN : [id number]  Tony Solomon is a 73 y.o. ([birth year]) male who presents with chief complaint of check circulation.  History of Present Illness:   The patient presents to [facility] for treatment of his atherosclerotic occlusive disease.  He was last seen in the office November 22, 2023.  He was seen for followup and review status post angiogram without intervention on 11/08/2023.    This procedure was largely diagnostic.  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 The right common femoral is essentially occluded beginning in the proximal common femoral artery throughout its entirety.    The patient notes no improvement in the lower extremity symptoms.  He has significant claudication with mild rest pain symptoms.   There have been no significant changes to the patient's overall health care.   No documented history of amaurosis fugax or recent TIA symptoms. There are no recent neurological changes noted. No documented history of DVT, PE or superficial thrombophlebitis. The patient denies recent episodes of angina or shortness of breath.   Current Meds  Medication Sig   aspirin  EC 81 MG tablet Take 81 mg by mouth daily. Swallow whole.   calcium  carbonate (TUMS - DOSED IN MG ELEMENTAL CALCIUM ) 500 MG chewable tablet Chew 1 tablet by mouth daily.   levothyroxine  (SYNTHROID ) 50 MCG tablet Take 50 mcg by mouth daily before breakfast.   Multiple Vitamins-Minerals (MULTIVITAMIN GUMMIES ADULT PO) Take by mouth.    rosuvastatin  (CRESTOR ) 20 MG tablet Take 20 mg by mouth daily.   tadalafil (CIALIS) 5 MG tablet Take 5 mg by mouth daily as needed.   telmisartan (MICARDIS) 80 MG tablet Take 80 mg by mouth daily.    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 (HCC)    Cancer (HCC)    skin cancer on face and hands   GERD (gastroesophageal reflux disease)    HTN, goal below 140/80    Hyperlipidemia    Hypertension    Hypothyroidism    Mixed hyperlipidemia    Thyroid  disease    Thyroid  disease    Vitamin D deficiency     Past Surgical History:  Procedure Laterality Date   APPENDECTOMY     at the age of 3   LOWER EXTREMITY ANGIOGRAPHY Right 11/08/2023   Procedure: Lower Extremity Angiography;  Surgeon: Jama Cordella MATSU, MD;  Location: [facility] INVASIVE CV LAB;  Service: Cardiovascular;  Laterality: Right;   LOWER EXTREMITY INTERVENTION Right 11/08/2023   Procedure: LOWER EXTREMITY INTERVENTION;  Surgeon: Jama Cordella MATSU, MD;  Location: [facility] INVASIVE CV LAB;  Service: Cardiovascular;  Laterality: Right;    Social History Social History   Tobacco Use   Smoking status: Former    Types:  Cigarettes    Start date: 1998  Substance Use Topics   Alcohol use: Yes    Alcohol/week: 26.0 - 27.0 standard drinks of alcohol    Types: 25 Cans of beer, 1 - 2 Standard drinks or equivalent per week    Family History Family History  Problem Relation Age of Onset   Cancer Mother    Heart disease Brother     No Known Allergies   REVIEW OF SYSTEMS (Negative unless checked)  Constitutional: [] Weight loss  [] Fever  [] Chills Cardiac: [] Chest pain   [] Chest pressure   [] Palpitations   [] Shortness of breath when laying flat   [] Shortness of breath with exertion. Vascular:  [x] Pain in legs with walking   [] Pain in legs at rest  [] History of DVT   [] Phlebitis   [] Swelling in legs   [] Varicose veins   [] Non-healing ulcers Pulmonary:   [] Uses home oxygen    [] Productive cough   [] Hemoptysis   [] Wheeze  [] COPD   [] Asthma Neurologic:  [] Dizziness   [] Seizures   [] History of stroke   [] History of TIA  [] Aphasia   [] Vissual changes   [] Weakness or numbness in arm   [] Weakness or numbness in leg Musculoskeletal:   [] Joint swelling   [] Joint pain   [] Low back pain Hematologic:  [] Easy bruising  [] Easy bleeding   [] Hypercoagulable state   [] Anemic Gastrointestinal:  [] Diarrhea   [] Vomiting  [] Gastroesophageal reflux/heartburn   [] Difficulty swallowing. Genitourinary:  [] Chronic kidney disease   [] Difficult urination  [] Frequent urination   [] Blood in urine Skin:  [] Rashes   [] Ulcers  Psychological:  [] History of anxiety   []  History of major depression.  Physical Examination  Vitals:   02/15/24 0623  BP: (!) 151/72  Pulse: 71  Resp: 16  Temp: 97.7 F (36.5 C)  TempSrc: Temporal  SpO2: 99%  Weight: 60.5 kg  Height: 5' 2 (1.575 m)   Body mass index is 24.38 kg/m. Gen: WD/WN, NAD Head: [place]/AT, No temporalis wasting.  Ear/Nose/Throat: Hearing grossly intact, nares w/o erythema or drainage Eyes: PER, EOMI, sclera nonicteric.  Neck: Supple, no masses.  No bruit or JVD.  Pulmonary:  Good air movement, no audible wheezing, no use of accessory muscles.  Cardiac: RRR, normal S1, S2, no Murmurs. Vascular:  mild trophic changes, no open wounds Vessel Right Left  Radial Palpable Palpable  PT Not Palpable Not Palpable  DP Not Palpable Not Palpable  Gastrointestinal: soft, non-distended. No guarding/no peritoneal signs.  Musculoskeletal: M/S 5/5 throughout.  No visible deformity.  Neurologic: CN 2-12 intact. Pain and light touch intact in extremities.  Symmetrical.  Speech is fluent. Motor exam as listed above. Psychiatric: Judgment intact, Mood & affect appropriate for pt's clinical situation. Dermatologic: No rashes or ulcers noted.  No changes consistent with cellulitis.   CBC Lab Results  Component Value Date   WBC 9.7 02/06/2024   HGB  14.0 02/06/2024   HCT 39.7 02/06/2024   MCV 91.7 02/06/2024   PLT 267 02/06/2024    BMET    Component Value Date/Time   NA 136 02/06/2024 1119   K 3.6 02/06/2024 1119   CL 96 (L) 02/06/2024 1119   CO2 27 02/06/2024 1119   GLUCOSE 100 (H) 02/06/2024 1119   BUN 13 02/06/2024 1119   CREATININE 0.82 02/06/2024 1119   CALCIUM  9.3 02/06/2024 1119   GFRNONAA >60 02/06/2024 1119   Estimated Creatinine Clearance: 60.1 mL/min (by C-G formula based on SCr of 0.82 mg/dL).  COAG No results found  for: INR, PROTIME  Radiology No results found.   Assessment/Plan 1. 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  This represents a limb threatening ischemia and places the patient at a high risk for limb loss.   Angiography has been performed and the situation is not ideal for intervention.  Given this finding open surgical repair is recommended.    Patient should undergo arterial reconstruction, bilateral femoral endarterectomies with Endologix stent graft.  It was also discussed that if we are unable to cross the aortic lesion that he may require a aortobifem bypass.  This is done with the hope for limb salvage.  The risks and benefits as well as the alternative therapies was discussed in detail with the patient.  All questions were answered.  Patient agrees to proceed with open vascular surgical reconstruction.   The patient will follow up with me in the office after the procedure.      2. Essential hypertension Continue antihypertensive medications as already ordered, these medications have been reviewed and there are no changes at this time.     Cordella Shawl, MD  02/15/2024 7:19 AM

## 2024-02-15 NOTE — Anesthesia Preprocedure Evaluation (Addendum)
 Anesthesia Evaluation  Patient identified by MRN, date of birth, ID band Patient awake    Reviewed: Allergy & Precautions, NPO status , Patient's Chart, lab work & pertinent test results  History of Anesthesia Complications Negative for: history of anesthetic complications  Airway Mallampati: III  TM Distance: >3 FB Neck ROM: full    Dental  (+) Partial Upper, Partial Lower   Pulmonary former smoker   Pulmonary exam normal        Cardiovascular hypertension, On Medications + CAD and + Peripheral Vascular Disease  Normal cardiovascular exam     Neuro/Psych negative neurological ROS  negative psych ROS   GI/Hepatic Neg liver ROS,GERD  Medicated,,  Endo/Other  Hypothyroidism    Renal/GU negative Renal ROS  negative genitourinary   Musculoskeletal   Abdominal   Peds  Hematology negative hematology ROS (+)   Anesthesia Other Findings Past Medical History: No date: 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  (HCC) No date: Cancer (HCC)     Comment:  skin cancer on face and hands No date: GERD (gastroesophageal reflux disease) No date: HTN, goal below 140/80 No date: Hyperlipidemia No date: Hypertension No date: Hypothyroidism No date: Mixed hyperlipidemia No date: Thyroid  disease No date: Thyroid  disease No date: Vitamin D deficiency  Past Surgical History: No date: APPENDECTOMY     Comment:  at the age of 37 11/08/2023: LOWER EXTREMITY ANGIOGRAPHY; Right     Comment:  Procedure: Lower Extremity Angiography;  Surgeon:               Jama Cordella MATSU, MD;  Location: [facility] INVASIVE CV LAB;               Service: Cardiovascular;  Laterality: Right; 11/08/2023: LOWER EXTREMITY INTERVENTION; Right     Comment:  Procedure: LOWER EXTREMITY INTERVENTION;  Surgeon:               Jama Cordella MATSU, MD;  Location: [facility] INV[facility] CV [facility];               Service: Cardiovascular;  Laterality: Right;  BMI     Body Mass Index: 24.38 kg/m      Reproductive/Obstetrics negative OB ROS                              Anesthesia Physical Anesthesia Plan  ASA: 3  Anesthesia Plan: General ETT   Post-op Pain Management: Ofirmev  IV (intra-op)* and Toradol IV (intra-op)*   Induction: Intravenous  PONV Risk Score and Plan: 2 and Ondansetron , Dexamethasone  and Treatment may vary due to age or medical condition  Airway Management Planned: Oral ETT  Additional Equipment:   Intra-op Plan:   Post-operative Plan: Extubation in OR  Informed Consent: I have reviewed the patients History and Physical, chart, labs and discussed the procedure including the risks, benefits and alternatives for the proposed anesthesia with the patient or authorized representative who has indicated his/her understanding and acceptance.     Dental Advisory Given  Plan Discussed with: Anesthesiologist, CRNA and Surgeon  Anesthesia Plan Comments: (Patient consented for risks of anesthesia including but not limited to:  - adverse reactions to medications - damage to eyes, teeth, lips or other oral mucosa - nerve damage due to positioning  - sore throat or hoarseness - Damage to heart, brain, nerves, lungs, other parts of body or loss of life  Patient voiced understanding and assent.)  Anesthesia Quick Evaluation

## 2024-02-15 NOTE — Interval H&P Note (Signed)
 History and Physical Interval Note:  02/15/2024 7:21 AM  Tony Solomon  has presented today for surgery, with the diagnosis of ASO WITH REST PAIN.  The various methods of treatment have been discussed with the patient and family. After consideration of risks, benefits and other options for treatment, the patient has consented to  Procedure(s) with comments: ENDARTERECTOMY, FEMORAL (Bilateral) INSERTION, ENDOVASCULAR STENT GRAFT, AORTA, ABDOMINAL (N/A) - ENDOLOGIX STENT GRAFT APPLICATION OF CELL SAVER (N/A) as a surgical intervention.  The patient's history has been reviewed, patient examined, no change in status, stable for surgery.  I have reviewed the patient's chart and labs.  Questions were answered to the patient's satisfaction.     Cordella Shawl

## 2024-02-16 ENCOUNTER — Encounter: Payer: Self-pay | Admitting: Vascular Surgery

## 2024-02-16 DIAGNOSIS — Z95828 Presence of other vascular implants and grafts: Secondary | ICD-10-CM

## 2024-02-16 DIAGNOSIS — I70229 Atherosclerosis of native arteries of extremities with rest pain, unspecified extremity: Secondary | ICD-10-CM

## 2024-02-16 DIAGNOSIS — Z9889 Other specified postprocedural states: Secondary | ICD-10-CM

## 2024-02-16 DIAGNOSIS — I7090 Unspecified atherosclerosis: Secondary | ICD-10-CM

## 2024-02-16 LAB — GLUCOSE, CAPILLARY
Glucose-Capillary: 165 mg/dL — ABNORMAL HIGH (ref 70–99)
Glucose-Capillary: 144 mg/dL — ABNORMAL HIGH (ref 70–99)
Glucose-Capillary: 157 mg/dL — ABNORMAL HIGH (ref 70–99)
Glucose-Capillary: 130 mg/dL — ABNORMAL HIGH (ref 70–99)

## 2024-02-16 LAB — BASIC METABOLIC PANEL WITH GFR
Anion gap: 7 (ref 5–15)
BUN: 12 mg/dL (ref 8–23)
CO2: 25 mmol/L (ref 22–32)
Calcium: 8.4 mg/dL — ABNORMAL LOW (ref 8.9–10.3)
Chloride: 99 mmol/L (ref 98–111)
Creatinine, Ser: 1.11 mg/dL (ref 0.61–1.24)
GFR, Estimated: >60 mL/min (ref >60)
Glucose, Bld: 167 mg/dL — ABNORMAL HIGH (ref 70–99)
Potassium: 4.7 mmol/L (ref 3.5–5.1)
Sodium: 131 mmol/L — ABNORMAL LOW (ref 135–145)

## 2024-02-16 LAB — CBC
HCT: 28.2 % — ABNORMAL LOW (ref 39.0–52.0)
Hemoglobin: 9.8 g/dL — ABNORMAL LOW (ref 13.0–17.0)
MCH: 32.6 pg (ref 26.0–34.0)
MCHC: 34.8 g/dL (ref 30.0–36.0)
MCV: 93.7 fL (ref 80.0–100.0)
Platelets: 186 K/uL (ref 150–400)
RBC: 3.01 MIL/uL — ABNORMAL LOW (ref 4.22–5.81)
RDW: 12.3 % (ref 11.5–15.5)
WBC: 21.3 K/uL — ABNORMAL HIGH (ref 4.0–10.5)
nRBC: 0 % (ref 0.0–0.2)

## 2024-02-16 NOTE — Progress Notes (Signed)
 The patient has been able to void. He voide 450 ml after foley removal this morning.

## 2024-02-16 NOTE — Plan of Care (Signed)
  Problem: Education: Goal: Knowledge of General Education information will improve Description: Including pain rating scale, medication(s)/side effects and non-pharmacologic comfort measures Outcome: Progressing   Problem: Health Behavior/Discharge Planning: Goal: Ability to manage health-related needs will improve Outcome: Progressing   Problem: Clinical Measurements: Goal: Ability to maintain clinical measurements within normal limits will improve Outcome: Progressing Goal: Will remain free from infection Outcome: Progressing Goal: Diagnostic test results will improve Outcome: Progressing Goal: Respiratory complications will improve Outcome: Progressing Goal: Cardiovascular complication will be avoided Outcome: Progressing   Problem: Activity: Goal: Risk for activity intolerance will decrease Outcome: Progressing   Problem: Nutrition: Goal: Adequate nutrition will be maintained Outcome: Progressing   Problem: Coping: Goal: Level of anxiety will decrease Outcome: Progressing   Problem: Elimination: Goal: Will not experience complications related to bowel motility Outcome: Progressing Goal: Will not experience complications related to urinary retention Outcome: Progressing   Problem: Pain Managment: Goal: General experience of comfort will improve and/or be controlled Outcome: Progressing   Problem: Safety: Goal: Ability to remain free from injury will improve Outcome: Progressing   Problem: Skin Integrity: Goal: Risk for impaired skin integrity will decrease Outcome: Progressing   Problem: Education: Goal: Knowledge of prescribed regimen will improve Outcome: Progressing   Problem: Activity: Goal: Ability to tolerate increased activity will improve Outcome: Progressing   Problem: Bowel/Gastric: Goal: Gastrointestinal status for postoperative course will improve Outcome: Progressing   Problem: Clinical Measurements: Goal: Postoperative complications  will be avoided or minimized Outcome: Progressing Goal: Signs and symptoms of graft occlusion will improve Outcome: Progressing   Problem: Skin Integrity: Goal: Demonstration of wound healing without infection will improve Outcome: Progressing   Problem: Education: Goal: Ability to describe self-care measures that may prevent or decrease complications (Diabetes Survival Skills Education) will improve Outcome: Progressing Goal: Individualized Educational Video(s) Outcome: Progressing   Problem: Coping: Goal: Ability to adjust to condition or change in health will improve Outcome: Progressing   Problem: Fluid Volume: Goal: Ability to maintain a balanced intake and output will improve Outcome: Progressing   Problem: Health Behavior/Discharge Planning: Goal: Ability to identify and utilize available resources and services will improve Outcome: Progressing Goal: Ability to manage health-related needs will improve Outcome: Progressing   Problem: Metabolic: Goal: Ability to maintain appropriate glucose levels will improve Outcome: Progressing   Problem: Nutritional: Goal: Maintenance of adequate nutrition will improve Outcome: Progressing Goal: Progress toward achieving an optimal weight will improve Outcome: Progressing   Problem: Skin Integrity: Goal: Risk for impaired skin integrity will decrease Outcome: Progressing   Problem: Tissue Perfusion: Goal: Adequacy of tissue perfusion will improve Outcome: Progressing

## 2024-02-16 NOTE — Progress Notes (Signed)
  Progress Note    02/16/2024 12:43 PM 1 Day Post-Op  Subjective:  Tony Solomon is a 73 yo male now POD #1 from::   PROCEDURE: Bilateral common femoral, superficial femoral and profunda femoris endarterectomy with bovine pericardial patch angioplasty. Open angioplasty and stent placement of the abdominal aorta and bilateral common iliac arteries using a combination of the 22 x 60 by 13 x 40Endologix stent graft with individual Lifestream stents for the bilateral common iliac arteries. Placement of 9 mm diameter by 58 mm length Lifestream stent on the right and placement of 9 mm diameter by 37 mm length Lifestream stent on the left   Patient is resting comfortably in bed in ICU this morning. Patient is ambulating, urinating and eating well. No complaints overnight. Endorses some soreness to his groins but is manageable. Vitals all remain stable.    Vitals:   02/16/24 0730 02/16/24 0800  BP:    Pulse: 90   Resp: 16   Temp:  97.9 F (36.6 C)  SpO2: 98%    Physical Exam: Cardiac:  RRR, Normal S1,S2. No murmurs Lungs:  Clear throughout and diminished in the bases. Non Labored breathing, No rales rhonchi or wheezing.  Incisions:  Bilateral Groin incisions, with dressings clean dry and intact.  Extremities:  Bilateral lower extremity with strong doppler pulses. Both lower extremities are warm to touch.  Abdomen:  Positive bowel sounds throughout,  Neurologic: AAOX3, Follows commands and answers questions appropriately.   CBC    Component Value Date/Time   WBC 21.3 (H) 02/16/2024 0447   RBC 3.01 (L) 02/16/2024 0447   HGB 9.8 (L) 02/16/2024 0447   HCT 28.2 (L) 02/16/2024 0447   PLT 186 02/16/2024 0447   MCV 93.7 02/16/2024 0447   MCH 32.6 02/16/2024 0447   MCHC 34.8 02/16/2024 0447   RDW 12.3 02/16/2024 0447   LYMPHSABS 2.9 02/06/2024 1119   MONOABS 0.8 02/06/2024 1119   EOSABS 0.2 02/06/2024 1119   BASOSABS 0.1 02/06/2024 1119    BMET    Component Value Date/Time   NA  131 (L) 02/16/2024 0447   K 4.7 02/16/2024 0447   CL 99 02/16/2024 0447   CO2 25 02/16/2024 0447   GLUCOSE 167 (H) 02/16/2024 0447   BUN 12 02/16/2024 0447   CREATININE 1.11 02/16/2024 0447   CALCIUM  8.4 (L) 02/16/2024 0447   GFRNONAA >60 02/16/2024 0447    INR No results found for: INR   Intake/Output Summary (Last 24 hours) at 02/16/2024 1243 Last data filed at 02/16/2024 0900 Gross per 24 hour  Intake 1875.05 ml  Output 1285 ml  Net 590.05 ml     Assessment/Plan:  73 y.o. male is s/p SEE ABOVE 1 Day Post-Op   PLAN Start ASA 81 mg Daily and Plavix  75 mg Daily.  Pain Medication PRN OOB and walk with assist. Advance diet as tolerated.  Okay to transfer to the floor.   DVT prophylaxis:  ASA 81 mg daily and Plavix  75 mg Daily, Lovenox  40 mg SQ Q24hrs.   Gwendlyn JONELLE [facility]ank [facility] 02/16/2024 12:43 PM

## 2024-02-16 NOTE — Progress Notes (Signed)
 Report has been called to 1A's charge nurse. The patient will need to be transfer to room 153 when the room is clean.

## 2024-02-16 NOTE — Evaluation (Signed)
 Physical Therapy Evaluation and Discharge  Patient Details Name: Tony Solomon MRN: [id number] DOB: February 08, 1951 Today's Date: 02/16/2024  History of Present Illness  Patient is a 73 year old male s/p endarterectomy, Open angioplasty and stent placement abdominal aorta and bilateral common iliac arteries. PMH: HTN  Clinical Impression  Patient is agreeable to PT evaluation. He is independent at baseline with ambulation distance limited by pain.  Today the patient is Mod I or supervision for all mobility. He ambulated laps around the nursing station without assistive device and no loss of balance. Patient is hopeful for discharge home soon with spouse support. No apparent acute PT needs at this time.       If plan is discharge home, recommend the following: Assist for transportation   Can travel by private vehicle        Equipment Recommendations None recommended by PT  Recommendations for Other Services       Functional Status Assessment Patient has had a recent decline in their functional status and demonstrates the ability to make significant improvements in function in a reasonable and predictable amount of time.     Precautions / Restrictions Precautions Precautions:  (low fall risk) Restrictions Weight Bearing Restrictions Per Provider Order: No      Mobility  Bed Mobility Overal bed mobility: Modified Independent                  Transfers Overall transfer level: Modified independent                      Ambulation/Gait Ambulation/Gait assistance: Supervision Gait Distance (Feet): 240 Feet Assistive device: None Gait Pattern/deviations: Decreased stance time - left Gait velocity: decreased     General Gait Details: mild decreased stance time LLE due to left anterior thigh pain. no loss of balance without assistive device. no shortness of breath with activity  Stairs            Wheelchair Mobility     Tilt Bed    Modified Rankin  (Stroke Patients Only)       Balance Overall balance assessment: No apparent balance deficits (not formally assessed)                                           Pertinent Vitals/Pain Pain Assessment Pain Assessment: 0-10 Pain Score: 5  Pain Location: L inner thigh Pain Descriptors / Indicators: Discomfort Pain Intervention(s): Limited activity within patient's tolerance, Monitored during session, Repositioned    Home Living Family/patient expects to be discharged to:: Private residence Living Arrangements: Spouse/significant other;Children Available Help at Discharge: Family Type of Home: House Home Access: Stairs to enter   Secretary/administrator of Steps: 2     Home Equipment: Cane - single point      Prior Function Prior Level of Function : Independent/Modified Independent;Driving             Mobility Comments: limited distance due to pain       Extremity/Trunk Assessment   Upper Extremity Assessment Upper Extremity Assessment: Overall WFL for tasks assessed    Lower Extremity Assessment Lower Extremity Assessment: Overall WFL for tasks assessed       Communication   Communication Communication: No apparent difficulties    Cognition Arousal: Alert Behavior During Therapy: WFL for tasks assessed/performed   PT - Cognitive impairments: No apparent impairments  Following commands: Intact       Cueing Cueing Techniques: Verbal cues     General Comments General comments (skin integrity, edema, etc.): encouraged routine, short distance ambulation for conditioning    Exercises     Assessment/Plan    PT Assessment Patient does not need any further PT services  PT Problem List         PT Treatment Interventions      PT Goals (Current goals can be found in the Care Plan section)  Acute Rehab PT Goals PT Goal Formulation: All assessment and education complete, DC therapy    Frequency        Co-evaluation PT/OT/SLP Co-Evaluation/Treatment: Yes Reason for Co-Treatment: Complexity of the patient's impairments (multi-system involvement)           AM-PAC PT 6 Clicks Mobility  Outcome Measure Help needed turning from your back to your side while in a flat bed without using bedrails?: None Help needed moving from lying on your back to sitting on the side of a flat bed without using bedrails?: None Help needed moving to and from a bed to a chair (including a wheelchair)?: None Help needed standing up from a chair using your arms (e.g., wheelchair or bedside chair)?: None Help needed to walk in hospital room?: None Help needed climbing 3-5 steps with a railing? : None 6 Click Score: 24    End of Session Equipment Utilized During Treatment: Gait belt Activity Tolerance: Patient tolerated treatment well Patient left: in chair;with call bell/phone within reach;with family/visitor present Nurse Communication: Mobility status PT Visit Diagnosis: Difficulty in walking, not elsewhere classified (R26.2)    Time: 9159-9144 PT Time Calculation (min) (ACUTE ONLY): 15 min   Charges:   PT Evaluation $PT Eval Low Complexity: 1 Low   PT General Charges $$ ACUTE PT VISIT: 1 Visit         Randine Essex, PT, MPT   [name] 02/16/2024, 9:07 AM

## 2024-02-16 NOTE — Evaluation (Signed)
 Occupational Therapy Evaluation Patient Details Name: Tony Solomon MRN: [id number] DOB: 01-10-1951 Today's Date: 02/16/2024   History of Present Illness   Patient is a 73 year old male s/p endarterectomy, Open angioplasty and stent placement abdominal aorta and bilateral common iliac arteries. PMH: HTN     Clinical Impressions Tony Solomon was seen for OT evaluation this date. Prior to hospital admission, pt was IND. Pt lives with spouse. Pt currently requires MOD A don B compression socks. SUP for ADL t/f no AD use, tolerated ~400 ft. Educated on falls prevention and compression stocking mgmt. No skilled OT needs, will sign off. Upon hospital discharge, recommend no OT follow up.    If plan is discharge home, recommend the following:   Help with stairs or ramp for entrance     Functional Status Assessment   Patient has had a recent decline in their functional status and demonstrates the ability to make significant improvements in function in a reasonable and predictable amount of time.     Equipment Recommendations   None recommended by OT     Recommendations for Other Services         Precautions/Restrictions   Precautions Precautions: None Restrictions Weight Bearing Restrictions Per Provider Order: No     Mobility Bed Mobility Overal bed mobility: Modified Independent                  Transfers Overall transfer level: Modified independent                        Balance Overall balance assessment: No apparent balance deficits (not formally assessed)                                         ADL either performed or assessed with clinical judgement   ADL Overall ADL's : Needs assistance/impaired                                       General ADL Comments: MOD A don B compression socks. SUP for ADL t/f no AD use, tolerated ~400 ft      Pertinent Vitals/Pain Pain Assessment Pain Assessment:  0-10 Pain Score: 5  Pain Location: L inner thigh Pain Descriptors / Indicators: Discomfort Pain Intervention(s): Limited activity within patient's tolerance     Extremity/Trunk Assessment Upper Extremity Assessment Upper Extremity Assessment: Overall WFL for tasks assessed   Lower Extremity Assessment Lower Extremity Assessment: Overall WFL for tasks assessed       Communication Communication Communication: No apparent difficulties   Cognition Arousal: Alert Behavior During Therapy: WFL for tasks assessed/performed Cognition: No apparent impairments                               Following commands: Intact       Cueing  General Comments   Cueing Techniques: Verbal cues  encouraged routine, short distance ambulation for conditioning   Exercises     Shoulder Instructions      Home Living Family/patient expects to be discharged to:: Private residence Living Arrangements: Spouse/significant other;Children Available Help at Discharge: Family Type of Home: House Home Access: Stairs to enter [employer] of Steps: 2  Home Equipment: Cane - single point          Prior Functioning/Environment Prior Level of Function : Independent/Modified Independent;Driving             Mobility Comments: limited distance due to pain      OT Problem List: Decreased activity tolerance   OT Treatment/Interventions:        OT Goals(Current goals can be found in the care plan section)   Acute Rehab OT Goals Patient Stated Goal: to go home OT Goal Formulation: With patient Time For Goal Achievement: 02/16/24 Potential to Achieve Goals: Good   OT Frequency:       Co-evaluation PT/OT/SLP Co-Evaluation/Treatment: Yes Reason for Co-Treatment: Complexity of the patient's impairments (multi-system involvement)   OT goals addressed during session: ADL's and self-care      AM-PAC OT 6 Clicks Daily Activity     Outcome  Measure Help from another person eating meals?: None Help from another person taking care of personal grooming?: None Help from another person toileting, which includes using toliet, bedpan, or urinal?: None Help from another person bathing (including washing, rinsing, drying)?: None Help from another person to put on and taking off regular upper body clothing?: None Help from another person to put on and taking off regular lower body clothing?: None 6 Click Score: 24   End of Session Equipment Utilized During Treatment: Gait belt Nurse Communication: Mobility status  Activity Tolerance: Patient tolerated treatment well Patient left: in bed;with call bell/phone within reach;with family/visitor present  OT Visit Diagnosis: Unsteadiness on feet (R26.81);Muscle weakness (generalized) (M62.81)                Time: 9159-9144 OT Time Calculation (min): 15 min Charges:  OT General Charges $OT Visit: 1 Visit OT Evaluation $OT Eval Low Complexity: 1 Low  [name], M.S. OTR/L  02/16/24, 9:33 AM  ascom [phone]

## 2024-02-17 ENCOUNTER — Encounter: Payer: Self-pay | Admitting: Vascular Surgery

## 2024-02-17 DIAGNOSIS — I7090 Unspecified atherosclerosis: Secondary | ICD-10-CM

## 2024-02-17 LAB — CBC
HCT: 26.6 % — ABNORMAL LOW (ref 39.0–52.0)
Hemoglobin: 9.2 g/dL — ABNORMAL LOW (ref 13.0–17.0)
MCH: 32.1 pg (ref 26.0–34.0)
MCHC: 34.6 g/dL (ref 30.0–36.0)
MCV: 92.7 fL (ref 80.0–100.0)
Platelets: 164 K/uL (ref 150–400)
RBC: 2.87 MIL/uL — ABNORMAL LOW (ref 4.22–5.81)
RDW: 12.2 % (ref 11.5–15.5)
WBC: 13 K/uL — ABNORMAL HIGH (ref 4.0–10.5)
nRBC: 0 % (ref 0.0–0.2)

## 2024-02-17 LAB — BASIC METABOLIC PANEL WITH GFR
Anion gap: 13 (ref 5–15)
BUN: 13 mg/dL (ref 8–23)
CO2: 25 mmol/L (ref 22–32)
Calcium: 8.8 mg/dL — ABNORMAL LOW (ref 8.9–10.3)
Chloride: 97 mmol/L — ABNORMAL LOW (ref 98–111)
Creatinine, Ser: 0.75 mg/dL (ref 0.61–1.24)
GFR, Estimated: >60 mL/min (ref >60)
Glucose, Bld: 121 mg/dL — ABNORMAL HIGH (ref 70–99)
Potassium: 3.8 mmol/L (ref 3.5–5.1)
Sodium: 135 mmol/L (ref 135–145)

## 2024-02-17 LAB — GLUCOSE, CAPILLARY: Glucose-Capillary: 144 mg/dL — ABNORMAL HIGH (ref 70–99)

## 2024-02-17 LAB — SURGICAL PATHOLOGY

## 2024-02-17 MED ORDER — OXYCODONE HCL 5 MG PO TABS: 5.0000 mg | ORAL_TABLET | ORAL | 0 refills | Status: AC | PRN

## 2024-02-17 MED ORDER — CLOPIDOGREL BISULFATE 75 MG PO TABS: 75.0000 mg | ORAL_TABLET | Freq: Every day | ORAL | 11 refills | Status: DC

## 2024-02-17 NOTE — Progress Notes (Signed)
   02/17/24 0900  Mobility  Activity Ambulated independently;Respositioned in chair  Level of Assistance Standby assist, set-up cues, supervision of patient - no hands on  Assistive Device None  Distance Ambulated (ft) 360 ft  Range of Motion/Exercises All extremities  Activity Response Tolerated well  Mobility visit 1 Mobility  Mobility Specialist Start Time (ACUTE ONLY) 0925  Mobility Specialist Stop Time (ACUTE ONLY) 0930  Mobility Specialist Time Calculation (min) (ACUTE ONLY) 5 min   Mobility Specialist - Progress Note Pt was in the chair upon entry. Pt agreed to mobility. Pt was able to STS independently. Pt was able to have good gait pattern and balance. After activity pt returned to the chair with needs in reach.  Clem Rodes Mobility Specialist 02/17/24, 10:01 AM

## 2024-02-17 NOTE — Plan of Care (Signed)
  Problem: Education: Goal: Knowledge of General Education information will improve Description: Including pain rating scale, medication(s)/side effects and non-pharmacologic comfort measures Outcome: Progressing   Problem: Health Behavior/Discharge Planning: Goal: Ability to manage health-related needs will improve Outcome: Progressing   Problem: Clinical Measurements: Goal: Ability to maintain clinical measurements within normal limits will improve Outcome: Progressing Goal: Will remain free from infection Outcome: Progressing Goal: Diagnostic test results will improve Outcome: Progressing Goal: Respiratory complications will improve Outcome: Progressing Goal: Cardiovascular complication will be avoided Outcome: Progressing   Problem: Activity: Goal: Risk for activity intolerance will decrease Outcome: Progressing   Problem: Nutrition: Goal: Adequate nutrition will be maintained Outcome: Progressing   Problem: Coping: Goal: Level of anxiety will decrease Outcome: Progressing   Problem: Elimination: Goal: Will not experience complications related to bowel motility Outcome: Progressing Goal: Will not experience complications related to urinary retention Outcome: Progressing   Problem: Pain Managment: Goal: General experience of comfort will improve and/or be controlled Outcome: Progressing   Problem: Safety: Goal: Ability to remain free from injury will improve Outcome: Progressing   Problem: Skin Integrity: Goal: Risk for impaired skin integrity will decrease Outcome: Progressing   Problem: Education: Goal: Knowledge of prescribed regimen will improve Outcome: Progressing   Problem: Activity: Goal: Ability to tolerate increased activity will improve Outcome: Progressing   Problem: Bowel/Gastric: Goal: Gastrointestinal status for postoperative course will improve Outcome: Progressing   Problem: Clinical Measurements: Goal: Postoperative complications  will be avoided or minimized Outcome: Progressing Goal: Signs and symptoms of graft occlusion will improve Outcome: Progressing   Problem: Skin Integrity: Goal: Demonstration of wound healing without infection will improve Outcome: Progressing   Problem: Education: Goal: Ability to describe self-care measures that may prevent or decrease complications (Diabetes Survival Skills Education) will improve Outcome: Progressing Goal: Individualized Educational Video(s) Outcome: Progressing   Problem: Coping: Goal: Ability to adjust to condition or change in health will improve Outcome: Progressing   Problem: Fluid Volume: Goal: Ability to maintain a balanced intake and output will improve Outcome: Progressing   Problem: Health Behavior/Discharge Planning: Goal: Ability to identify and utilize available resources and services will improve Outcome: Progressing Goal: Ability to manage health-related needs will improve Outcome: Progressing   Problem: Metabolic: Goal: Ability to maintain appropriate glucose levels will improve Outcome: Progressing   Problem: Nutritional: Goal: Maintenance of adequate nutrition will improve Outcome: Progressing Goal: Progress toward achieving an optimal weight will improve Outcome: Progressing   Problem: Skin Integrity: Goal: Risk for impaired skin integrity will decrease Outcome: Progressing   Problem: Tissue Perfusion: Goal: Adequacy of tissue perfusion will improve Outcome: Progressing

## 2024-02-17 NOTE — Plan of Care (Signed)
  Problem: Education: Goal: Knowledge of General Education information will improve Description: Including pain rating scale, medication(s)/side effects and non-pharmacologic comfort measures Outcome: Completed/Met   Problem: Clinical Measurements: Goal: Ability to maintain clinical measurements within normal limits will improve Outcome: Completed/Met   Problem: Activity: Goal: Risk for activity intolerance will decrease Outcome: Completed/Met   Problem: Elimination: Goal: Will not experience complications related to bowel motility Outcome: Completed/Met   Problem: Pain Managment: Goal: General experience of comfort will improve and/or be controlled Outcome: Completed/Met   Problem: Safety: Goal: Ability to remain free from injury will improve Outcome: Completed/Met   Problem: Skin Integrity: Goal: Risk for impaired skin integrity will decrease Outcome: Completed/Met

## 2024-02-17 NOTE — Discharge Summary (Signed)
 [facility]    Discharge Summary    Patient ID:  Tony Solomon MRN: [id number] DOB/AGE: [birth year] 73 y.o.  Admit date: 02/15/2024 Discharge date: 02/17/2024 Date of Surgery: 02/15/2024 Surgeon: Surgeon(s): Schnier, Cordella MATSU, MD Marea Selinda RAMAN, MD  Admission Diagnosis: Atherosclerosis of native artery of both lower extremities with rest pain [facility]) [I70.223] Atherosclerosis of artery of extremity with rest pain [facility]) [I70.229]  Discharge Diagnoses:  Atherosclerosis of native artery of both lower extremities with rest pain (HCC) [I70.223] Atherosclerosis of artery of extremity with rest pain (HCC) [I70.229]  Secondary Diagnoses: 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 Patient is ambulating, urinating and eating well. No complaints overnight. Endorses some soreness to his groins but is manageable. Vitals all remain stable. Patient to be discharged to home today.   Hospital Course:  ZAVEON Solomon is a 73 y.o. male is S/P Bilateral Endarterectomies.  PROCEDURE: Bilateral common femoral, superficial femoral and profunda femoris endarterectomy with bovine pericardial patch angioplasty. Open angioplasty and stent placement of the abdominal aorta and bilateral common iliac arteries using a combination of the 22 x 60 by 13 x 40Endologix stent graft with individual Lifestream stents for the bilateral common iliac  arteries. Placement of 9 mm diameter by 58 mm length Lifestream stent on the right and placement of 9 mm diameter by 37 mm length Lifestream stent on the left   Patient is recovering as expected.  Noted bilateral groin incisions with staples clean dry and intact.  No complications to note.  Patient is eating well, ambulating well and urinating well.  Patient be discharged home later today.  Patient is being discharged on aspirin  81 mg daily, Plavix  75 mg daily and Crestor  20 mg daily.  Patient was counseled not to miss or skip taking any of these medications as it will alter the outcome of his surgery.  Both patient and wife verbalized her understanding today.  I spent greater than 60 minutes in developing, implementing, teaching and discharging this patient.   Extubated: POD # 0 Physical Exam:  Alert notes x3, no acute distress Face: Symmetrical.  Tongue is midline. Neck: Trachea is midline.  No swelling or bruising. Cardiovascular: Regular rate and rhythm Pulmonary: Clear to auscultation bilaterally Abdomen: Soft, nontender, nondistended Right groin access: Clean dry and intact.  No swelling or drainage noted Left groin access: Clean dry and intact.  No swelling or drainage noted Left lower extremity: Thigh soft.  Calf soft.  Extremities warm distally toes.  Hard to palpate pedal pulses however the foot is warm is her good capillary refill. Right lower extremity: Thigh soft.  Calf soft.  Extremities warm distally toes.  Hard to palpate pedal pulses however the foot is warm is her good capillary refill. Neurological: No deficits noted   Post-op wounds:  clean, dry, intact or healing well  Pt. Ambulating, voiding and taking  PO diet without difficulty. Pt pain controlled with PO pain meds.  Labs:  As below  Complications: none  Consults:    Significant Diagnostic Studies: CBC Lab Results  Component Value Date   WBC 13.0 (H) 02/17/2024   HGB 9.2 (L) 02/17/2024   HCT 26.6  (L) 02/17/2024   MCV 92.7 02/17/2024   PLT 164 02/17/2024    BMET    Component Value Date/Time   NA 135 02/17/2024 0655   K 3.8 02/17/2024 0655   CL 97 (L) 02/17/2024 0655   CO2 25 02/17/2024 0655   GLUCOSE 121 (H) 02/17/2024 0655   BUN 13 02/17/2024 0655   CREATININE 0.75 02/17/2024 0655   CALCIUM  8.8 (L) 02/17/2024 0655   GFRNONAA >60 02/17/2024 0655   COAG No results found for: INR, PROTIME   Disposition:  Discharge to :Home  Allergies as of 02/17/2024   No Known Allergies      Medication List     TAKE these medications    aspirin  EC 81 MG tablet Take 81 mg by mouth daily. Swallow whole.   calcium  carbonate 500 MG chewable tablet Commonly known as: TUMS - dosed in mg elemental calcium  Chew 1 tablet by mouth daily.   clopidogrel  75 MG tablet Commonly known as: PLAVIX  Take 1 tablet (75 mg total) by mouth daily at 6 (six) AM. Start taking on: February 18, 2024   levothyroxine  50 MCG tablet Commonly known as: SYNTHROID  Take 50 mcg by mouth daily before breakfast.   MULTIVITAMIN GUMMIES ADULT PO Take by mouth.   oxyCODONE  5 MG immediate release tablet Commonly known as: Oxy IR/ROXICODONE  Take 1 tablet (5 mg total) by mouth every 4 (four) hours as needed for moderate pain (pain score 4-6) or severe pain (pain score 7-10).   rosuvastatin  20 MG tablet Commonly known as: CRESTOR  Take 20 mg by mouth daily.   tadalafil 5 MG tablet Commonly known as: CIALIS Take 5 mg by mouth daily as needed.   telmisartan 80 MG tablet Commonly known as: MICARDIS Take 80 mg by mouth daily.       Verbal and written Discharge instructions given to the patient. Wound care per Discharge AVS  Follow-up Information     Schnier, Cordella MATSU, MD Follow up in 3 week(s).   Specialties: Vascular Surgery, Cardiology, Radiology, Vascular Surgery Why: Bilateral lower extremity Arterial Duplex Ultrasounds with ABI's Contact information: [address] [place] [address] [phone]                 Signed: Gwendlyn JONELLE Shank, NP  02/17/2024, 10:26 AM

## 2024-02-17 NOTE — Progress Notes (Signed)
 Reviewed discharge note with patient. Patient acknowledged understanding. Patient discharged with personal belongings. Patient wheeled out by staff. Patient transported home via family vehicle. No distress noted in patient.

## 2024-02-17 NOTE — Plan of Care (Signed)
   Problem: Education: Goal: Knowledge of General Education information will improve Description Including pain rating scale, medication(s)/side effects and non-pharmacologic comfort measures Outcome: Progressing   Problem: Activity: Goal: Risk for activity intolerance will decrease Outcome: Progressing   Problem: Safety: Goal: Ability to remain free from injury will improve Outcome: Progressing

## 2024-02-17 NOTE — Discharge Instructions (Addendum)
 Vascular surgery discharge instructions:  Do not lift anything heavy for the next 2 weeks.  Do not lift anything more than a gallon of milk.  Do not drive for the next 2 weeks.  Do not drive while taking any narcotic medication at any time.  You may shower tomorrow after you get home today on 02/18/2024 Saturday.  Shower with the old dressings in place and remove immediately after showering.  Pat the area dry.  Paint the staples with Betadine sticks daily until staples are removed. These will be given to you by the nurse discharging you.  Covered your staples with a dressing to keep them clean.  You are being discharged on aspirin  81 mg daily, Plavix  75 mg daily and Crestor  20 mg daily.  Do not miss or skip taking any of these medications as it will interfere with the outcome of your surgery.  Follow-up with vein and vascular surgery as scheduled.

## 2024-02-21 ENCOUNTER — Telehealth (INDEPENDENT_AMBULATORY_CARE_PROVIDER_SITE_OTHER): Payer: Self-pay | Admitting: Vascular Surgery

## 2024-02-21 NOTE — Telephone Encounter (Signed)
 He had anesthesia which slows the bowels and also pain medication also slows the bowels as well.  So while it isn't normal, it isn't uncommon.  He should start to take miralax daily.  If he drinks coffee, he should put in there.  He should also drink lots of water .  Hopefully in a few days it will help stimulate a bowel movement.

## 2024-02-21 NOTE — Telephone Encounter (Signed)
 Spoken to patient and notified Fallon's comments. Verbalized understanding.

## 2024-02-21 NOTE — Telephone Encounter (Signed)
 Patient left a message stating that he have not had a bowel movement since surgery on 02/15/2024. Is this normal? Patient would like some advise to what he should do.  Please advise.

## 2024-02-27 ENCOUNTER — Other Ambulatory Visit: Payer: Self-pay | Admitting: Internal Medicine

## 2024-02-27 DIAGNOSIS — I10 Essential (primary) hypertension: Secondary | ICD-10-CM | POA: Diagnosis not present

## 2024-02-27 DIAGNOSIS — E782 Mixed hyperlipidemia: Secondary | ICD-10-CM | POA: Diagnosis not present

## 2024-02-27 DIAGNOSIS — M7989 Other specified soft tissue disorders: Secondary | ICD-10-CM | POA: Diagnosis not present

## 2024-02-27 DIAGNOSIS — E559 Vitamin D deficiency, unspecified: Secondary | ICD-10-CM | POA: Diagnosis not present

## 2024-02-27 DIAGNOSIS — E079 Disorder of thyroid, unspecified: Secondary | ICD-10-CM | POA: Diagnosis not present

## 2024-02-27 DIAGNOSIS — Z79899 Other long term (current) drug therapy: Secondary | ICD-10-CM | POA: Diagnosis not present

## 2024-02-27 DIAGNOSIS — E118 Type 2 diabetes mellitus with unspecified complications: Secondary | ICD-10-CM | POA: Diagnosis not present

## 2024-02-28 ENCOUNTER — Ambulatory Visit
Admission: RE | Admit: 2024-02-28 | Discharge: 2024-02-28 | Disposition: A | Discharge status: Home / Self Care | Source: Ambulatory Visit | Attending: Internal Medicine | Admitting: Internal Medicine

## 2024-02-28 DIAGNOSIS — M7122 Synovial cyst of popliteal space [Baker], left knee: Secondary | ICD-10-CM | POA: Diagnosis not present

## 2024-02-28 DIAGNOSIS — M7989 Other specified soft tissue disorders: Secondary | ICD-10-CM | POA: Insufficient documentation

## 2024-03-13 ENCOUNTER — Other Ambulatory Visit (INDEPENDENT_AMBULATORY_CARE_PROVIDER_SITE_OTHER): Payer: Self-pay | Admitting: Vascular Surgery

## 2024-03-13 DIAGNOSIS — I739 Peripheral vascular disease, unspecified: Secondary | ICD-10-CM

## 2024-03-15 ENCOUNTER — Encounter (INDEPENDENT_AMBULATORY_CARE_PROVIDER_SITE_OTHER)

## 2024-03-15 ENCOUNTER — Encounter (INDEPENDENT_AMBULATORY_CARE_PROVIDER_SITE_OTHER): Payer: Self-pay | Admitting: Vascular Surgery

## 2024-03-15 ENCOUNTER — Ambulatory Visit (INDEPENDENT_AMBULATORY_CARE_PROVIDER_SITE_OTHER): Admitting: Vascular Surgery

## 2024-03-15 ENCOUNTER — Ambulatory Visit (INDEPENDENT_AMBULATORY_CARE_PROVIDER_SITE_OTHER)

## 2024-03-15 VITALS — BP 168/71 | HR 74 | Resp 18 | Ht 62.0 in | Wt 131.4 lb

## 2024-03-15 DIAGNOSIS — Z9889 Other specified postprocedural states: Secondary | ICD-10-CM

## 2024-03-15 DIAGNOSIS — I739 Peripheral vascular disease, unspecified: Secondary | ICD-10-CM | POA: Diagnosis not present

## 2024-03-15 DIAGNOSIS — I70221 Atherosclerosis of native arteries of extremities with rest pain, right leg: Secondary | ICD-10-CM

## 2024-03-15 NOTE — Progress Notes (Signed)
 Patient ID: Tony Solomon, male   DOB: 16-Feb-1951, 73 y.o.   MRN: [id number]  Chief Complaint  Patient presents with   Follow-up    4 week follow up w/ ABI's    HPI [name] is a 73 y.o. male.    Procedure 02/15/2024: Bilateral common femoral, superficial femoral and profunda femoris endarterectomy with bovine pericardial patch angioplasty. Open angioplasty and stent placement of the abdominal aorta and bilateral common iliac arteries using a combination of the 22 x 60 by 13 x 40Endologix stent graft with individual Lifestream stents for the bilateral common iliac arteries. Placement of 9 mm diameter by 58 mm length Lifestream stent in the right common iliac and placement of 9 mm diameter by 37 mm length Lifestream stent in the left common iliac essentially raising the flow divider of the stent graft slightly.  Patient reports he has been doing well his legs feel much better.  He denies any groin complications.  No fever or chills.  He is anxious to get his staples removed.   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    Cancer (HCC)    skin cancer on face and hands   GERD (gastroesophageal reflux disease)    HTN, goal below 140/80    Hyperlipidemia    Hypertension    Hypothyroidism    Mixed hyperlipidemia    Thyroid  disease    Thyroid  disease    Vitamin D deficiency     Past Surgical History:  Procedure Laterality Date   ABDOMINAL AORTIC ENDOVASCULAR STENT GRAFT N/A 02/15/2024   Procedure: INSERTION, ENDOVASCULAR STENT GRAFT, AORTA, ABDOMINAL;  Surgeon: Tony Cordella MATSU, MD;  Location: [facility];  Service: Vascular;  Laterality: N/A;  ENDOLOGIX STENT GRAFT   APPENDECTOMY     at the age of 88   APPLICATION OF CELL SAVER N/A 02/15/2024   Procedure: APPLICATION OF CELL SAVER;  Surgeon: Tony Cordella MATSU, MD;  Location: [facility];  Service: Vascular;  Laterality: N/A;   ENDARTERECTOMY FEMORAL Bilateral  02/15/2024   Procedure: ENDARTERECTOMY, FEMORAL;  Surgeon: Tony Cordella MATSU, MD;  Location: [facility];  Service: Vascular;  Laterality: Bilateral;   LOWER EXTREMITY ANGIOGRAPHY Right 11/08/2023   Procedure: Lower Extremity Angiography;  Surgeon: Tony Cordella MATSU, MD;  Location: [facility];  Service: Cardiovascular;  Laterality: Right;   LOWER EXTREMITY INTERVENTION Right 11/08/2023   Procedure: LOWER EXTREMITY INTERVENTION;  Surgeon: Tony Cordella MATSU, MD;  Location: [facility];  Service: Cardiovascular;  Laterality: Right;      No Known Allergies  Current Outpatient Medications  Medication Sig Dispense Refill   aspirin  EC 81 MG tablet Take 81 mg by mouth daily. Swallow whole.     calcium  carbonate (TUMS - DOSED IN MG ELEMENTAL CALCIUM ) 500 MG chewable tablet Chew 1 tablet by mouth daily.     clopidogrel  (PLAVIX ) 75 MG tablet Take 1 tablet (75 mg total) by mouth daily at 6 (six) AM. 30 tablet 11   levothyroxine  (SYNTHROID ) 50 MCG tablet Take 50 mcg by mouth daily before breakfast.     Multiple Vitamins-Minerals (MULTIVITAMIN GUMMIES ADULT PO) Take by mouth.     oxyCODONE  (OXY IR/ROXICODONE ) 5 MG immediate release tablet Take 1 tablet (5 mg total) by mouth every 4 (four) hours as needed for moderate pain (pain score 4-6) or severe pain (pain score 7-10). 30 tablet 0   rosuvastatin  (CRESTOR ) 20 MG tablet Take 20  mg by mouth daily.     tadalafil (CIALIS) 5 MG tablet Take 5 mg by mouth daily as needed.     telmisartan (MICARDIS) 80 MG tablet Take 80 mg by mouth daily.     No current facility-administered medications for this visit.        Physical Exam BP (!) 168/71 (BP Location: Right Arm, Patient Position: Sitting, Cuff Size: Normal)   Pulse 74   Resp 18   Ht 5' 2 (1.575 m)   Wt 131 lb 6.4 oz (59.6 kg)   BMI 24.03 kg/m  Gen:  WD/WN, NAD Skin: incision C/D/I; staples removed without difficulty     Assessment/Plan: 1. Atherosclerosis of native artery  of right lower extremity with rest pain (HCC) (Primary) Recommend:  The patient is status post successful bilateral femoral endarterectomies with intervention.  The patient reports that the claudication symptoms and leg pain has improved.   The patient denies lifestyle limiting changes at this point in time.  No further invasive studies, angiography or surgery at this time. The patient should continue walking and begin a more formal exercise program.  The patient should continue antiplatelet therapy and aggressive treatment of the lipid abnormalities  Continued surveillance is indicated as atherosclerosis is likely to progress with time.    Patient should undergo noninvasive studies as ordered. The patient will follow up with me to review the studies.  - VAS US  AORTA/IVC/ILIACS; Future - VAS US  ABI WITH/WO TBI; Future      Cordella Shawl 03/15/2024, 8:47 AM   This note was created with Dragon medical transcription system.  Any errors from dictation are unintentional.

## 2024-03-19 LAB — VAS US ABI WITH/WO TBI
Left ABI: 1.05
Right ABI: 1.16

## 2024-04-06 DIAGNOSIS — Z79899 Other long term (current) drug therapy: Secondary | ICD-10-CM | POA: Diagnosis not present

## 2024-04-06 DIAGNOSIS — I10 Essential (primary) hypertension: Secondary | ICD-10-CM | POA: Diagnosis not present

## 2024-04-06 DIAGNOSIS — E559 Vitamin D deficiency, unspecified: Secondary | ICD-10-CM | POA: Diagnosis not present

## 2024-04-06 DIAGNOSIS — E782 Mixed hyperlipidemia: Secondary | ICD-10-CM | POA: Diagnosis not present

## 2024-04-06 DIAGNOSIS — E079 Disorder of thyroid, unspecified: Secondary | ICD-10-CM | POA: Diagnosis not present

## 2024-04-06 DIAGNOSIS — E118 Type 2 diabetes mellitus with unspecified complications: Secondary | ICD-10-CM | POA: Diagnosis not present

## 2024-06-14 ENCOUNTER — Ambulatory Visit (INDEPENDENT_AMBULATORY_CARE_PROVIDER_SITE_OTHER): Admitting: Vascular Surgery

## 2024-06-14 ENCOUNTER — Encounter (INDEPENDENT_AMBULATORY_CARE_PROVIDER_SITE_OTHER)

## 2024-06-17 DIAGNOSIS — E785 Hyperlipidemia, unspecified: Secondary | ICD-10-CM | POA: Insufficient documentation

## 2024-06-17 NOTE — Progress Notes (Unsigned)
 "                                                                      MRN : [id number]  Tony Solomon is a 74 y.o. (05-22-1951) male who presents with chief complaint of check circulation.  History of Present Illness:   The patient returns to the office for followup and review status post angiogram with intervention on 02/15/2024.   Procedure:  02/15/2024: Bilateral common femoral, superficial femoral and profunda femoris endarterectomy with bovine pericardial patch angioplasty. Open angioplasty and stent placement of the abdominal aorta and bilateral common iliac arteries using a combination of the 22 x 60 by 13 x 40Endologix stent graft with individual Lifestream stents for the bilateral common iliac arteries. Placement of 9 mm diameter by 58 mm length Lifestream stent in the right common iliac and placement of 9 mm diameter by 37 mm length Lifestream stent in the left common iliac essentially raising the flow divider of the stent graft slightly.   Patient reports he has been doing well his legs feel much better.  He denies any groin complications.  No fever or chills.  He is anxious to get his staples removed.  The patient notes improvement in the lower extremity symptoms. No interval shortening of the patient's claudication distance or rest pain symptoms. No new ulcers or wounds have occurred since the last visit.  There have been no significant changes to the patient's overall health care.  No documented history of amaurosis fugax or recent TIA symptoms. There are no recent neurological changes noted. No documented history of DVT, PE or superficial thrombophlebitis. The patient denies recent episodes of angina or shortness of breath.   ABI's Rt=*** and Lt=***  (previous ABI's Rt=*** and Lt=***) Duplex US  of the *** lower extremity arterial system shows ***  Active Medications[1]  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     Cancer (HCC)    skin cancer on face and hands   GERD (gastroesophageal reflux disease)    HTN, goal below 140/80    Hyperlipidemia    Hypertension    Hypothyroidism    Mixed hyperlipidemia    Thyroid  disease    Thyroid  disease    Vitamin D deficiency     Past Surgical History:  Procedure Laterality Date   ABDOMINAL AORTIC ENDOVASCULAR STENT GRAFT N/A 02/15/2024   Procedure: INSERTION, ENDOVASCULAR STENT GRAFT, AORTA, ABDOMINAL;  Surgeon: Jama Cordella MATSU, MD;  Location: [facility];  Service: Vascular;  Laterality: N/A;  ENDOLOGIX STENT GRAFT   APPENDECTOMY     at the age of 69   APPLICATION OF CELL SAVER N/A 02/15/2024   Procedure: APPLICATION OF CELL SAVER;  Surgeon: Jama Cordella MATSU, MD;  Location: [facility];  Service: Vascular;  Laterality: N/A;   ENDARTERECTOMY FEMORAL Bilateral 02/15/2024   Procedure: ENDARTERECTOMY, FEMORAL;  Surgeon: Jama Cordella MATSU, MD;  Location: [facility];  Service: Vascular;  Laterality: Bilateral;   LOWER EXTREMITY ANGIOGRAPHY Right 11/08/2023   Procedure: Lower Extremity Angiography;  Surgeon: Jama Cordella MATSU, MD;  Location: [facility] INVAS[facility] CV [facility];  Service: Cardiovascular;  Laterality: Right;   LOWER EXTREMITY INTERVENTION Right 11/08/2023   Procedure: LOWER EXTREMITY INTERVENTION;  Surgeon: Jama Cordella MATSU, MD;  Location: [facility] INVAS[facility] CV [facility];  Service: Cardiovascular;  Laterality: Right;    Social History Social History[2]  Family History Family History  Problem Relation Age of Onset   Cancer Mother    Heart disease Brother     Allergies[3]   REVIEW OF SYSTEMS (Negative unless checked)  Constitutional: [] Weight loss  [] Fever  [] Chills Cardiac: [] Chest pain   [] Chest pressure   [] Palpitations   [] Shortness of breath when laying flat   [] Shortness of breath with exertion. Vascular:  [x] Pain in legs with walking   [] Pain in legs at rest  [] History of DVT   [] Phlebitis   [] Swelling in legs   [] Varicose veins   [] Non-healing  ulcers Pulmonary:   [] Uses home oxygen   [] Productive cough   [] Hemoptysis   [] Wheeze  [] COPD   [] Asthma Neurologic:  [] Dizziness   [] Seizures   [] History of stroke   [] History of TIA  [] Aphasia   [] Vissual changes   [] Weakness or numbness in arm   [] Weakness or numbness in leg Musculoskeletal:   [] Joint swelling   [] Joint pain   [] Low back pain Hematologic:  [] Easy bruising  [] Easy bleeding   [] Hypercoagulable state   [] Anemic Gastrointestinal:  [] Diarrhea   [] Vomiting  [] Gastroesophageal reflux/heartburn   [] Difficulty swallowing. Genitourinary:  [] Chronic kidney disease   [] Difficult urination  [] Frequent urination   [] Blood in urine Skin:  [] Rashes   [] Ulcers  Psychological:  [] History of anxiety   []  History of major depression.  Physical Examination  There were no vitals filed for this visit. There is no height or weight on file to calculate BMI. Gen: WD/WN, NAD Head: /AT, No temporalis wasting.  Ear/Nose/Throat: Hearing grossly intact, nares w/o erythema or drainage Eyes: PER, EOMI, sclera nonicteric.  Neck: Supple, no masses.  No bruit or JVD.  Pulmonary:  Good air movement, no audible wheezing, no use of accessory muscles.  Cardiac: RRR, normal S1, S2, no Murmurs. Vascular:  mild trophic changes, no open wounds Vessel Right Left  Radial Palpable Palpable  PT Not Palpable Not Palpable  DP Not Palpable Not Palpable  Gastrointestinal: soft, non-distended. No guarding/no peritoneal signs.  Musculoskeletal: M/S 5/5 throughout.  No visible deformity.  Neurologic: CN 2-12 intact. Pain and light touch intact in extremities.  Symmetrical.  Speech is fluent. Motor exam as listed above. Psychiatric: Judgment intact, Mood & affect appropriate for pt's clinical situation. Dermatologic: No rashes or ulcers noted.  No changes consistent with cellulitis.   CBC Lab Results  Component Value Date   WBC 13.0 (H) 02/17/2024   HGB 9.2 (L) 02/17/2024   HCT 26.6 (L) 02/17/2024   MCV 92.7  02/17/2024   PLT 164 02/17/2024    BMET    Component Value Date/Time   NA 135 02/17/2024 0655   K 3.8 02/17/2024 0655   CL 97 (L) 02/17/2024 0655   CO2 25 02/17/2024 0655   GLUCOSE 121 (H) 02/17/2024 0655   BUN 13 02/17/2024 0655   CREATININE 0.75 02/17/2024 0655   CALCIUM  8.8 (L) 02/17/2024 0655   GFRNONAA >60 02/17/2024 0655   CrCl cannot be calculated (Patient's most recent lab result is older than the maximum 21 days allowed.).  COAG No results found for: INR, PROTIME  Radiology No results found.   Assessment/Plan There are no diagnoses linked to this encounter.   Cordella Jama, MD  06/17/2024 1:35 PM      [1]  No outpatient medications have been marked as taking  for the 06/18/24 encounter (Appointment) with Jama, Cordella MATSU, MD.  [2]  Social History Tobacco Use   Smoking status: Former    Types: Cigarettes    Start date: 1998  Substance Use Topics   Alcohol use: Yes    Alcohol/week: 26.0 - 27.0 standard drinks of alcohol    Types: 25 Cans of beer, 1 - 2 Standard drinks or equivalent per week  [3] No Known Allergies  "

## 2024-06-18 ENCOUNTER — Ambulatory Visit (INDEPENDENT_AMBULATORY_CARE_PROVIDER_SITE_OTHER): Admitting: Vascular Surgery

## 2024-06-18 ENCOUNTER — Encounter (INDEPENDENT_AMBULATORY_CARE_PROVIDER_SITE_OTHER): Payer: Self-pay | Admitting: Vascular Surgery

## 2024-06-18 ENCOUNTER — Ambulatory Visit (INDEPENDENT_AMBULATORY_CARE_PROVIDER_SITE_OTHER)

## 2024-06-18 VITALS — BP 135/78 | HR 71 | Resp 17 | Ht 62.0 in | Wt 132.0 lb

## 2024-06-18 DIAGNOSIS — E782 Mixed hyperlipidemia: Secondary | ICD-10-CM | POA: Diagnosis not present

## 2024-06-18 DIAGNOSIS — E039 Hypothyroidism, unspecified: Secondary | ICD-10-CM | POA: Diagnosis not present

## 2024-06-18 DIAGNOSIS — I70221 Atherosclerosis of native arteries of extremities with rest pain, right leg: Secondary | ICD-10-CM | POA: Diagnosis not present

## 2024-06-18 DIAGNOSIS — I10 Essential (primary) hypertension: Secondary | ICD-10-CM

## 2024-06-21 LAB — VAS US ABI WITH/WO TBI
Left ABI: 1.19
Right ABI: 1.12

## 2024-12-17 ENCOUNTER — Encounter (INDEPENDENT_AMBULATORY_CARE_PROVIDER_SITE_OTHER)

## 2024-12-17 ENCOUNTER — Ambulatory Visit (INDEPENDENT_AMBULATORY_CARE_PROVIDER_SITE_OTHER): Admitting: Vascular Surgery
